# Patient Record
Sex: Female | Born: 1970 | Race: White | Hispanic: No | Marital: Single | Smoking: Former smoker
Health system: Southern US, Community
[De-identification: ages and names within clinical notes are randomized; demographics above are authoritative.]

## PROBLEM LIST (undated history)

## (undated) DIAGNOSIS — E282 Polycystic ovarian syndrome: Secondary | ICD-10-CM

## (undated) DIAGNOSIS — E079 Disorder of thyroid, unspecified: Secondary | ICD-10-CM

## (undated) DIAGNOSIS — M1990 Unspecified osteoarthritis, unspecified site: Secondary | ICD-10-CM

## (undated) DIAGNOSIS — D649 Anemia, unspecified: Secondary | ICD-10-CM

## (undated) DIAGNOSIS — Z973 Presence of spectacles and contact lenses: Secondary | ICD-10-CM

## (undated) DIAGNOSIS — G43909 Migraine, unspecified, not intractable, without status migrainosus: Secondary | ICD-10-CM

## (undated) HISTORY — PX: COLONOSCOPY: SHX174

## (undated) HISTORY — DX: Unspecified osteoarthritis, unspecified site: M19.90

## (undated) HISTORY — DX: Anemia, unspecified: D64.9

## (undated) HISTORY — PX: THYROIDECTOMY, PARTIAL: SHX18

---

## 2003-11-22 HISTORY — PX: GASTRIC BYPASS: SHX52

## 2006-11-21 HISTORY — PX: BREAST ENHANCEMENT SURGERY: SHX7

## 2006-11-21 HISTORY — PX: ABDOMINAL SURGERY: SHX537

## 2011-10-17 ENCOUNTER — Emergency Department (HOSPITAL_BASED_OUTPATIENT_CLINIC_OR_DEPARTMENT_OTHER)
Admission: EM | Admit: 2011-10-17 | Discharge: 2011-10-17 | Disposition: A | Discharge status: Home / Self Care | Payer: Federal, State, Local not specified - PPO | Attending: Emergency Medicine | Admitting: Emergency Medicine

## 2011-10-17 ENCOUNTER — Encounter: Payer: Self-pay | Admitting: *Deleted

## 2011-10-17 DIAGNOSIS — J329 Chronic sinusitis, unspecified: Secondary | ICD-10-CM | POA: Insufficient documentation

## 2011-10-17 DIAGNOSIS — E079 Disorder of thyroid, unspecified: Secondary | ICD-10-CM | POA: Insufficient documentation

## 2011-10-17 DIAGNOSIS — J45909 Unspecified asthma, uncomplicated: Secondary | ICD-10-CM | POA: Insufficient documentation

## 2011-10-17 HISTORY — DX: Disorder of thyroid, unspecified: E07.9

## 2011-10-17 LAB — URINALYSIS, ROUTINE W REFLEX MICROSCOPIC
Ketones, ur: NEGATIVE mg/dL
Leukocytes, UA: NEGATIVE
Nitrite: NEGATIVE
Specific Gravity, Urine: 1.006 (ref 1.005–1.030)
pH: 7 (ref 5.0–8.0)

## 2011-10-17 LAB — PREGNANCY, URINE: Preg Test, Ur: NEGATIVE

## 2011-10-17 MED ORDER — CIPROFLOXACIN-DEXAMETHASONE 0.3-0.1 % OT SUSP: 4.0000 [drp] | Freq: Two times a day (BID) | OTIC | Status: AC

## 2011-10-17 MED ORDER — LEVOFLOXACIN 500 MG PO TABS: 500.0000 mg | ORAL_TABLET | Freq: Every day | ORAL | Status: AC

## 2011-10-17 NOTE — ED Provider Notes (Signed)
History/physical exam/procedure(s) were performed by non-physician practitioner and as supervising physician I was immediately available for consultation/collaboration. I have reviewed all notes and am in agreement with care and plan.   Hilario Quarry, MD 10/17/11 [phone]

## 2011-10-17 NOTE — ED Notes (Signed)
Pt states she thinks she has a UTI. UA ordered and urine sample obtained.

## 2011-10-17 NOTE — ED Provider Notes (Signed)
History     CSN: [id number] Arrival date & time: 10/17/2011  9:24 PM   First MD Initiated Contact with Patient 10/17/11 2131      Chief Complaint  Patient presents with  . URI    (Consider location/radiation/quality/duration/timing/severity/associated sxs/prior treatment) Patient is a 40 y.o. female presenting with URI. The history is provided by the patient. No language interpreter was used.  URI The primary symptoms include sore throat, swollen glands, cough and wheezing. Primary symptoms do not include fever. The current episode started more than 1 week ago. This is a new problem. The problem has been gradually worsening.  The sore throat began more than 2 days ago. The sore throat has been gradually worsening since its onset. The sore throat is moderate in intensity. The sensation radiates to the left ear.  The cough began more than 1 week ago. The cough is non-productive. The sputum is clear. It is exacerbated by allergens.  Symptoms associated with the illness include chills, plugged ear sensation, facial pain, sinus pressure, congestion and rhinorrhea.  Pt complains of itching bilat ears.  Pt reports she has sinus drainage and congestion. Pt reports she has had sinus infections in the past.  Pt reports this feels the same.  Pt reports levaquin is what normally works.  Past Medical History  Diagnosis Date  . Thyroid disease   . Asthma     Past Surgical History  Procedure Date  . Gastric bypass     No family history on file.  History  Substance Use Topics  . Smoking status: Former Games developer  . Smokeless tobacco: Not on file  . Alcohol Use: No    OB History    Grav Para Term Preterm Abortions TAB SAB Ect Mult Living                  Review of Systems  Constitutional: Positive for chills. Negative for fever.  HENT: Positive for congestion, sore throat, rhinorrhea and sinus pressure.   Respiratory: Positive for cough and wheezing.   All other systems reviewed and  are negative.    Allergies  Review of patient's allergies indicates not on file.  Home Medications  No current outpatient prescriptions on file.  BP 126/82  Pulse 75  Temp(Src) 98.1 F (36.7 C) (Oral)  Resp 20  SpO2 99%  Physical Exam  Nursing note and vitals reviewed. Constitutional: She is oriented to person, place, and time. She appears well-developed and well-nourished.  HENT:  Head: Normocephalic and atraumatic.  Right Ear: External ear normal.  Left Ear: External ear normal.       Tender bilat sinuses  Eyes: Conjunctivae and EOM are normal. Pupils are equal, round, and reactive to light.  Neck: Normal range of motion. Neck supple.  Cardiovascular: Normal rate, regular rhythm and normal heart sounds.   Pulmonary/Chest: Effort normal and breath sounds normal.  Abdominal: Soft. Bowel sounds are normal.  Musculoskeletal: Normal range of motion.  Neurological: She is alert and oriented to person, place, and time.  Skin: Skin is warm.  Psychiatric: She has a normal mood and affect.    ED Course  Procedures (including critical care time)  Labs Reviewed  URINALYSIS, ROUTINE W REFLEX MICROSCOPIC - Abnormal; Notable for the following:    Appearance HAZY (*)    All other components within normal limits  PREGNANCY, URINE   No results found.   No diagnosis found.    MDM     Pt wants rx for levaquin and  cipro ear drops.  Pt has sinusitis.       Langston Masker, [place] 10/17/11 2201  Langston Masker, [place] 10/17/11 2204

## 2011-10-17 NOTE — ED Notes (Signed)
Sore throat, stuffy ears, cough with yellow sputum x 1 week.

## 2012-01-12 ENCOUNTER — Encounter (HOSPITAL_BASED_OUTPATIENT_CLINIC_OR_DEPARTMENT_OTHER): Payer: Self-pay

## 2012-01-12 ENCOUNTER — Emergency Department (HOSPITAL_BASED_OUTPATIENT_CLINIC_OR_DEPARTMENT_OTHER)
Admission: EM | Admit: 2012-01-12 | Discharge: 2012-01-12 | Disposition: A | Discharge status: Home / Self Care | Payer: Federal, State, Local not specified - PPO | Attending: Emergency Medicine | Admitting: Emergency Medicine

## 2012-01-12 ENCOUNTER — Emergency Department (INDEPENDENT_AMBULATORY_CARE_PROVIDER_SITE_OTHER): Payer: Federal, State, Local not specified - PPO

## 2012-01-12 DIAGNOSIS — R6889 Other general symptoms and signs: Secondary | ICD-10-CM | POA: Insufficient documentation

## 2012-01-12 DIAGNOSIS — Z79899 Other long term (current) drug therapy: Secondary | ICD-10-CM | POA: Insufficient documentation

## 2012-01-12 DIAGNOSIS — J069 Acute upper respiratory infection, unspecified: Secondary | ICD-10-CM

## 2012-01-12 DIAGNOSIS — R109 Unspecified abdominal pain: Secondary | ICD-10-CM | POA: Insufficient documentation

## 2012-01-12 DIAGNOSIS — R05 Cough: Secondary | ICD-10-CM | POA: Insufficient documentation

## 2012-01-12 DIAGNOSIS — R5381 Other malaise: Secondary | ICD-10-CM | POA: Insufficient documentation

## 2012-01-12 DIAGNOSIS — R0982 Postnasal drip: Secondary | ICD-10-CM | POA: Insufficient documentation

## 2012-01-12 DIAGNOSIS — R319 Hematuria, unspecified: Secondary | ICD-10-CM | POA: Insufficient documentation

## 2012-01-12 DIAGNOSIS — J3489 Other specified disorders of nose and nasal sinuses: Secondary | ICD-10-CM | POA: Insufficient documentation

## 2012-01-12 DIAGNOSIS — R1110 Vomiting, unspecified: Secondary | ICD-10-CM | POA: Insufficient documentation

## 2012-01-12 DIAGNOSIS — N390 Urinary tract infection, site not specified: Secondary | ICD-10-CM | POA: Insufficient documentation

## 2012-01-12 DIAGNOSIS — R059 Cough, unspecified: Secondary | ICD-10-CM | POA: Insufficient documentation

## 2012-01-12 DIAGNOSIS — H9209 Otalgia, unspecified ear: Secondary | ICD-10-CM | POA: Insufficient documentation

## 2012-01-12 DIAGNOSIS — R300 Dysuria: Secondary | ICD-10-CM | POA: Insufficient documentation

## 2012-01-12 DIAGNOSIS — J45909 Unspecified asthma, uncomplicated: Secondary | ICD-10-CM | POA: Insufficient documentation

## 2012-01-12 HISTORY — DX: Polycystic ovarian syndrome: E28.2

## 2012-01-12 LAB — URINALYSIS, ROUTINE W REFLEX MICROSCOPIC
Glucose, UA: NEGATIVE mg/dL
Ketones, ur: NEGATIVE mg/dL
Leukocytes, UA: NEGATIVE
Protein, ur: NEGATIVE mg/dL

## 2012-01-12 LAB — URINE MICROSCOPIC-ADD ON

## 2012-01-12 MED ORDER — HYDROCODONE-HOMATROPINE 5-1.5 MG/5ML PO SYRP: 5.0000 mL | ORAL_SOLUTION | Freq: Four times a day (QID) | ORAL | Status: AC | PRN

## 2012-01-12 MED ORDER — CIPROFLOXACIN HCL 500 MG PO TABS: 500.0000 mg | ORAL_TABLET | Freq: Two times a day (BID) | ORAL | Status: AC

## 2012-01-12 MED ORDER — LEVOFLOXACIN 500 MG PO TABS: 500.0000 mg | ORAL_TABLET | Freq: Every day | ORAL | Status: AC

## 2012-01-12 MED ORDER — CIPROFLOXACIN HCL 500 MG PO TABS
500.0000 mg | ORAL_TABLET | Freq: Once | ORAL | Status: AC
  Administered 2012-01-12: 500 mg via ORAL
  Filled 2012-01-12: qty 1

## 2012-01-12 NOTE — ED Provider Notes (Signed)
History     CSN: [id number]  Arrival date & time 01/12/12  5784   First MD Initiated Contact with Patient 01/12/12 2036      Chief Complaint  Patient presents with  . URI    (Consider location/radiation/quality/duration/timing/severity/associated sxs/prior treatment) HPI Comments: Patient presents with one-week history of runny nose cough and congestion. She has a cough productive of yellow sputum. She does have some posttussive emesis. No other vomiting or diarrhea. No fevers. She also complains of some burning on urination for one day. She said she has a history of UTIs in the past and is still similar. Denies any vaginal bleeding or discharge. She's been taking over-the-counter medicines for her cold without relief.  Patient is a 41 y.o. female presenting with URI. The history is provided by the patient.  URI The primary symptoms include fatigue, ear pain, cough, abdominal pain and vomiting. Primary symptoms do not include fever, headaches, nausea, arthralgias or rash.  Symptoms associated with the illness include congestion and rhinorrhea. The illness is not associated with chills.    Past Medical History  Diagnosis Date  . Thyroid disease   . Asthma   . PCOS (polycystic ovarian syndrome)     Past Surgical History  Procedure Date  . Gastric bypass   . Thyroidectomy, partial   . Abdominal surgery     No family history on file.  History  Substance Use Topics  . Smoking status: Former Games developer  . Smokeless tobacco: Not on file  . Alcohol Use: No    OB History    Grav Para Term Preterm Abortions TAB SAB Ect Mult Living                  Review of Systems  Constitutional: Positive for fatigue. Negative for fever, chills and diaphoresis.  HENT: Positive for ear pain, congestion, rhinorrhea and postnasal drip. Negative for sneezing.   Eyes: Negative.   Respiratory: Positive for cough. Negative for chest tightness and shortness of breath.   Cardiovascular: Negative  for chest pain and leg swelling.  Gastrointestinal: Positive for vomiting and abdominal pain. Negative for nausea, diarrhea and blood in stool.  Genitourinary: Positive for dysuria. Negative for frequency, hematuria, flank pain and difficulty urinating.  Musculoskeletal: Negative for back pain and arthralgias.  Skin: Negative for rash.  Neurological: Negative for dizziness, speech difficulty, weakness, numbness and headaches.    Allergies  Penicillins; Iodine; and Sulfa antibiotics  Home Medications   Current Outpatient Rx  Name Route Sig Dispense Refill  . ALBUTEROL SULFATE HFA 108 (90 BASE) MCG/ACT IN AERS Inhalation Inhale 2 puffs into the lungs every 6 (six) hours as needed. For wheezing or shortness of breath    . ASPIRIN-ACETAMINOPHEN-CAFFEINE 250-250-65 MG PO TABS Oral Take 1 tablet by mouth every 6 (six) hours as needed. For migraine    . BUTALBITAL-ASA-CAFF-CODEINE 50-325-40-30 MG PO CAPS Oral Take 1 capsule by mouth every 4 (four) hours as needed. For migraines    . LEVOTHYROXINE SODIUM 125 MCG PO TABS Oral Take 125 mcg by mouth daily.    Marland Kitchen METFORMIN HCL 500 MG PO TABS Oral Take 500 mg by mouth 2 (two) times daily.    Marland Kitchen CIPROFLOXACIN HCL 500 MG PO TABS Oral Take 1 tablet (500 mg total) by mouth 2 (two) times daily. 20 tablet 0  . HYDROCODONE-HOMATROPINE 5-1.5 MG/5ML PO SYRP Oral Take 5 mLs by mouth every 6 (six) hours as needed for cough. 120 mL 0  . LEVOFLOXACIN 500 MG  PO TABS Oral Take 1 tablet (500 mg total) by mouth daily. 7 tablet 0    BP 114/73  Pulse 78  Temp(Src) 98 F (36.7 C) (Oral)  Resp 18  Ht 5\' 7"  (1.702 m)  Wt 160 lb (72.576 kg)  BMI 25.06 kg/m2  SpO2 100%  LMP 01/12/2012  Physical Exam  Constitutional: She is oriented to person, place, and time. She appears well-developed and well-nourished.  HENT:  Head: Normocephalic and atraumatic.  Eyes: Pupils are equal, round, and reactive to light.  Neck: Normal range of motion. Neck supple.  Cardiovascular:  Normal rate, regular rhythm and normal heart sounds.   Pulmonary/Chest: Effort normal and breath sounds normal. No respiratory distress. She has no wheezes. She has no rales. She exhibits no tenderness.  Abdominal: Soft. Bowel sounds are normal. There is tenderness. There is no rebound and no guarding.       Mild tenderness in suprapubic area no rebound or guarding  Musculoskeletal: Normal range of motion. She exhibits no edema.  Lymphadenopathy:    She has no cervical adenopathy.  Neurological: She is alert and oriented to person, place, and time.  Skin: Skin is warm and dry. No rash noted.  Psychiatric: She has a normal mood and affect.    ED Course  Procedures (including critical care time)  Results for orders placed during the hospital encounter of 01/12/12  PREGNANCY, URINE      Component Value Range   Preg Test, Ur NEGATIVE  NEGATIVE   URINALYSIS, ROUTINE W REFLEX MICROSCOPIC      Component Value Range   Color, Urine YELLOW  YELLOW    APPearance CLOUDY (*) CLEAR    Specific Gravity, Urine 1.005  1.005 - 1.030    pH 7.0  5.0 - 8.0    Glucose, UA NEGATIVE  NEGATIVE (mg/dL)   Hgb urine dipstick LARGE (*) NEGATIVE    Bilirubin Urine NEGATIVE  NEGATIVE    Ketones, ur NEGATIVE  NEGATIVE (mg/dL)   Protein, ur NEGATIVE  NEGATIVE (mg/dL)   Urobilinogen, UA 0.2  0.0 - 1.0 (mg/dL)   Nitrite NEGATIVE  NEGATIVE    Leukocytes, UA NEGATIVE  NEGATIVE   URINE MICROSCOPIC-ADD ON      Component Value Range   Squamous Epithelial / LPF FEW (*) RARE    WBC, UA 0-2  <3 (WBC/hpf)   RBC / HPF 7-10  <3 (RBC/hpf)   Bacteria, UA MANY (*) RARE    No results found.   Dg Chest 2 View  01/12/2012  *RADIOLOGY REPORT*  Clinical Data: Upper respiratory infection and cough.  CHEST - 2 VIEW  Comparison: None.  Findings: Two views of the chest were obtained.  The lungs are clear.  Heart and mediastinum are within normal limits and the trachea is midline.  Negative for edema or pleural effusions.   IMPRESSION: No acute chest findings.  Original Report Authenticated By: Richarda Overlie, M.D.     1. URI (upper respiratory infection)   2. UTI (lower urinary tract infection)   3. Hematuria       MDM  No evidence of pneumonia.  Advised to f/u with PMD to recheck urine to make sure that blood clears        Rolan Bucco, MD 01/12/12 2316

## 2012-01-12 NOTE — ED Notes (Signed)
C/o "uri infection and uti"

## 2012-01-12 NOTE — Discharge Instructions (Signed)
Upper Respiratory Infection, Adult An upper respiratory infection (URI) is also sometimes known as the common cold. The upper respiratory tract includes the nose, sinuses, throat, trachea, and bronchi. Bronchi are the airways leading to the lungs. Most people improve within 1 week, but symptoms can last up to 2 weeks. A residual cough may last even longer.  CAUSES Many different viruses can infect the tissues lining the upper respiratory tract. The tissues become irritated and inflamed and often become very moist. Mucus production is also common. A cold is contagious. You can easily spread the virus to others by oral contact. This includes kissing, sharing a glass, coughing, or sneezing. Touching your mouth or nose and then touching a surface, which is then touched by another person, can also spread the virus. SYMPTOMS  Symptoms typically develop 1 to 3 days after you come in contact with a cold virus. Symptoms vary from person to person. They may include:  Runny nose.   Sneezing.   Nasal congestion.   Sinus irritation.   Sore throat.   Loss of voice (laryngitis).   Cough.   Fatigue.   Muscle aches.   Loss of appetite.   Headache.   Low-grade fever.  DIAGNOSIS  You might diagnose your own cold based on familiar symptoms, since most people get a cold 2 to 3 times a year. Your caregiver can confirm this based on your exam. Most importantly, your caregiver can check that your symptoms are not due to another disease such as strep throat, sinusitis, pneumonia, asthma, or epiglottitis. Blood tests, throat tests, and X-rays are not necessary to diagnose a common cold, but they may sometimes be helpful in excluding other more serious diseases. Your caregiver will decide if any further tests are required. RISKS AND COMPLICATIONS  You may be at risk for a more severe case of the common cold if you smoke cigarettes, have chronic heart disease (such as heart failure) or lung disease (such as  asthma), or if you have a weakened immune system. The very young and very old are also at risk for more serious infections. Bacterial sinusitis, middle ear infections, and bacterial pneumonia can complicate the common cold. The common cold can worsen asthma and chronic obstructive pulmonary disease (COPD). Sometimes, these complications can require emergency medical care and may be life-threatening. PREVENTION  The best way to protect against getting a cold is to practice good hygiene. Avoid oral or hand contact with people with cold symptoms. Wash your hands often if contact occurs. There is no clear evidence that vitamin C, vitamin E, echinacea, or exercise reduces the chance of developing a cold. However, it is always recommended to get plenty of rest and practice good nutrition. TREATMENT  Treatment is directed at relieving symptoms. There is no cure. Antibiotics are not effective, because the infection is caused by a virus, not by bacteria. Treatment may include:  Increased fluid intake. Sports drinks offer valuable electrolytes, sugars, and fluids.   Breathing heated mist or steam (vaporizer or shower).   Eating chicken soup or other clear broths, and maintaining good nutrition.   Getting plenty of rest.   Using gargles or lozenges for comfort.   Controlling fevers with ibuprofen or acetaminophen as directed by your caregiver.   Increasing usage of your inhaler if you have asthma.  Zinc gel and zinc lozenges, taken in the first 24 hours of the common cold, can shorten the duration and lessen the severity of symptoms. Pain medicines may help with fever, muscle   aches, and throat pain. A variety of non-prescription medicines are available to treat congestion and runny nose. Your caregiver can make recommendations and may suggest nasal or lung inhalers for other symptoms.  HOME CARE INSTRUCTIONS   Only take over-the-counter or prescription medicines for pain, discomfort, or fever as directed  by your caregiver.   Use a warm mist humidifier or inhale steam from a shower to increase air moisture. This may keep secretions moist and make it easier to breathe.   Drink enough water and fluids to keep your urine clear or pale yellow.   Rest as needed.   Return to work when your temperature has returned to normal or as your caregiver advises. You may need to stay home longer to avoid infecting others. You can also use a face mask and careful hand washing to prevent spread of the virus.  SEEK MEDICAL CARE IF:   After the first few days, you feel you are getting worse rather than better.   You need your caregiver's advice about medicines to control symptoms.   You develop chills, worsening shortness of breath, or brown or red sputum. These may be signs of pneumonia.   You develop yellow or brown nasal discharge or pain in the face, especially when you bend forward. These may be signs of sinusitis.   You develop a fever, swollen neck glands, pain with swallowing, or white areas in the back of your throat. These may be signs of strep throat.  SEEK IMMEDIATE MEDICAL CARE IF:   You have a fever.   You develop severe or persistent headache, ear pain, sinus pain, or chest pain.   You develop wheezing, a prolonged cough, cough up blood, or have a change in your usual mucus (if you have chronic lung disease).   You develop sore muscles or a stiff neck.  Document Released: 05/03/2001 Document Revised: 07/20/2011 Document Reviewed: 03/11/2011 [employer] Patient Information 2012 [employer].Urinary Tract Infection Infections of the urinary tract can start in several places. A bladder infection (cystitis), a kidney infection (pyelonephritis), and a prostate infection (prostatitis) are different types of urinary tract infections (UTIs). They usually get better if treated with medicines (antibiotics) that kill germs. Take all the medicine until it is gone. You or your child may feel better in a  few days, but TAKE ALL MEDICINE or the infection may not respond and may become more difficult to treat. HOME CARE INSTRUCTIONS   Drink enough water and fluids to keep the urine clear or pale yellow. Cranberry juice is especially recommended, in addition to large amounts of water.   Avoid caffeine, tea, and carbonated beverages. They tend to irritate the bladder.   Alcohol may irritate the prostate.   Only take over-the-counter or prescription medicines for pain, discomfort, or fever as directed by your caregiver.  To prevent further infections:  Empty the bladder often. Avoid holding urine for long periods of time.   After a bowel movement, women should cleanse from front to back. Use each tissue only once.   Empty the bladder before and after sexual intercourse.  FINDING OUT THE RESULTS OF YOUR TEST Not all test results are available during your visit. If your or your child's test results are not back during the visit, make an appointment with your caregiver to find out the results. Do not assume everything is normal if you have not heard from your caregiver or the medical facility. It is important for you to follow up on all test results.   SEEK MEDICAL CARE IF:   There is back pain.   Your baby is older than 3 months with a rectal temperature of 100.5 F (38.1 C) or higher for more than 1 day.   Your or your child's problems (symptoms) are no better in 3 days. Return sooner if you or your child is getting worse.  SEEK IMMEDIATE MEDICAL CARE IF:   There is severe back pain or lower abdominal pain.   You or your child develops chills.   You have a fever.   Your baby is older than 3 months with a rectal temperature of 102 F (38.9 C) or higher.   Your baby is 3 months old or younger with a rectal temperature of 100.4 F (38 C) or higher.   There is nausea or vomiting.   There is continued burning or discomfort with urination.  MAKE SURE YOU:   Understand these  instructions.   Will watch your condition.   Will get help right away if you are not doing well or get worse.  Document Released: 08/17/2005 Document Revised: 07/20/2011 Document Reviewed: 03/22/2007 Exit[employer] Patient Information 2012 [employer]. 

## 2012-02-13 ENCOUNTER — Emergency Department (INDEPENDENT_AMBULATORY_CARE_PROVIDER_SITE_OTHER): Payer: Federal, State, Local not specified - PPO

## 2012-02-13 ENCOUNTER — Emergency Department (HOSPITAL_BASED_OUTPATIENT_CLINIC_OR_DEPARTMENT_OTHER)
Admission: EM | Admit: 2012-02-13 | Discharge: 2012-02-13 | Disposition: A | Discharge status: Home / Self Care | Payer: Federal, State, Local not specified - PPO | Attending: Emergency Medicine | Admitting: Emergency Medicine

## 2012-02-13 ENCOUNTER — Encounter (HOSPITAL_BASED_OUTPATIENT_CLINIC_OR_DEPARTMENT_OTHER): Payer: Self-pay | Admitting: *Deleted

## 2012-02-13 DIAGNOSIS — R05 Cough: Secondary | ICD-10-CM

## 2012-02-13 DIAGNOSIS — J019 Acute sinusitis, unspecified: Secondary | ICD-10-CM | POA: Insufficient documentation

## 2012-02-13 DIAGNOSIS — R300 Dysuria: Secondary | ICD-10-CM | POA: Insufficient documentation

## 2012-02-13 DIAGNOSIS — R059 Cough, unspecified: Secondary | ICD-10-CM | POA: Insufficient documentation

## 2012-02-13 DIAGNOSIS — R0989 Other specified symptoms and signs involving the circulatory and respiratory systems: Secondary | ICD-10-CM

## 2012-02-13 LAB — DIFFERENTIAL
Basophils Absolute: 0 10*3/uL (ref 0.0–0.1)
Basophils Relative: 0 % (ref 0–1)
Neutro Abs: 5.3 10*3/uL (ref 1.7–7.7)
Neutrophils Relative %: 62 % (ref 43–77)

## 2012-02-13 LAB — CBC
MCHC: 33.3 g/dL (ref 30.0–36.0)
Platelets: 317 10*3/uL (ref 150–400)
RDW: 11.9 % (ref 11.5–15.5)

## 2012-02-13 LAB — BASIC METABOLIC PANEL
Chloride: 104 mEq/L (ref 96–112)
GFR calc Af Amer: >90 mL/min (ref >90)
Potassium: 3.8 mEq/L (ref 3.5–5.1)

## 2012-02-13 LAB — URINALYSIS, ROUTINE W REFLEX MICROSCOPIC
Bilirubin Urine: NEGATIVE
Leukocytes, UA: NEGATIVE
Nitrite: NEGATIVE
Specific Gravity, Urine: 1.003 — ABNORMAL LOW (ref 1.005–1.030)
pH: 7 (ref 5.0–8.0)

## 2012-02-13 LAB — URINE MICROSCOPIC-ADD ON

## 2012-02-13 MED ORDER — LEVOFLOXACIN 500 MG PO TABS: 500.0000 mg | ORAL_TABLET | Freq: Every day | ORAL | Status: AC

## 2012-02-13 MED ORDER — LEVOFLOXACIN 750 MG PO TABS: 750.0000 mg | ORAL_TABLET | Freq: Every day | ORAL | Status: DC

## 2012-02-13 NOTE — ED Notes (Signed)
Earache, cough, sore throat. Symptoms x 1.5 weeks. Thinks she has a UTI.

## 2012-02-13 NOTE — ED Provider Notes (Signed)
History     CSN: [id number]  Arrival date & time 02/13/12  1609   First MD Initiated Contact with Patient 02/13/12 1634      Chief Complaint  Patient presents with  . URI    (Consider location/radiation/quality/duration/timing/severity/associated sxs/prior treatment) HPI The patient is a 41 yo woman, history of asthma, presenting with cough.  The patient notes a 1.5-week history of rhinorrhea, cough productive of yellow sputum, which has progressed to green sputum in the last 2 days, bilateral ear pain L > R, muscle aches, chills, and lightheadedness, though no fevers.  She has a history of frequent prior URI's, and reports being evaluated by ENT in the past with no structural cause of frequent URI's identified.  The patient also notes a 1-week history of dysuria and urinary frequency, though without urgency or suprapubic pain.  The patient has a history of asthma, with prior hospitalizations for asthma but no prior intubations.  Past Medical History  Diagnosis Date  . Thyroid disease   . Asthma   . PCOS (polycystic ovarian syndrome)     Past Surgical History  Procedure Date  . Gastric bypass   . Thyroidectomy, partial   . Abdominal surgery     No family history on file.  History  Substance Use Topics  . Smoking status: Former Games developer  . Smokeless tobacco: Not on file  . Alcohol Use: No    OB History    Grav Para Term Preterm Abortions TAB SAB Ect Mult Living                  Review of Systems General: no fevers, chills, changes in weight, changes in appetite Skin: no rash HEENT: see HPI Pulm: see HPI CV: no chest pain, palpitations, shortness of breath Abd: no abdominal pain, nausea/vomiting, diarrhea/constipation GU: see HPI Ext: no arthralgias, myalgias Neuro: no weakness, numbness, or tingling  Allergies  Penicillins; Iodine; and Sulfa antibiotics  Home Medications   Current Outpatient Rx  Name Route Sig Dispense Refill  . ALBUTEROL SULFATE HFA 108  (90 BASE) MCG/ACT IN AERS Inhalation Inhale 2 puffs into the lungs every 6 (six) hours as needed. For wheezing or shortness of breath    . ASPIRIN-ACETAMINOPHEN-CAFFEINE 250-250-65 MG PO TABS Oral Take 1 tablet by mouth every 6 (six) hours as needed. For migraine    . BUTALBITAL-ASA-CAFF-CODEINE 50-325-40-30 MG PO CAPS Oral Take 1 capsule by mouth every 4 (four) hours as needed. For migraines    . LEVOTHYROXINE SODIUM 125 MCG PO TABS Oral Take 125 mcg by mouth daily.    Marland Kitchen METFORMIN HCL 500 MG PO TABS Oral Take 500 mg by mouth 2 (two) times daily.      BP 111/75  Pulse 65  Temp(Src) 97.6 F (36.4 C) (Oral)  Resp 20  SpO2 100%  Physical Exam  ED Course  Procedures (including critical care time)  Labs Reviewed  URINALYSIS, ROUTINE W REFLEX MICROSCOPIC - Abnormal; Notable for the following:    Specific Gravity, Urine 1.003 (*)    Hgb urine dipstick TRACE (*)    All other components within normal limits  URINE MICROSCOPIC-ADD ON  CBC  DIFFERENTIAL  BASIC METABOLIC PANEL   No results found.   No diagnosis found.    MDM  The patient is a 41 yo woman, history of asthma, presenting with cough  # Cough - likely represents viral URI vs acute sinusitis.  CXR ordered to evaluate for pneumonia.  The patient also has a history  of asthma, which may be contributing to cough. -cbc, bmet -cxr  # Dysuria - reports a 1-week history of dysuria, but UA shows neg nitrates and leuk esterase.  Patient likely does not have UTI, no further treatment necessary.  # Dispo - will await labs and imaging, but patient can likely be discharged home.  Signed, Janalyn Harder, PGY1 02/13/2012, 5:31 PM  Addendum 6:42 pm - cxr shows no opacity, labs unremarkable.  Since symptoms have persisted 11 days, will treat acute sinusitis with antibiotics.  Patient has sulfa and pcn allergy, and reports that doxycycline "hasn't worked for her in the past".  Will prescribe a 10-day course of levofloxacin 500 mg  PO.  Linward Headland, MD 02/13/12 [phone]

## 2012-02-13 NOTE — Discharge Instructions (Signed)

## 2012-02-13 NOTE — ED Notes (Signed)
Pt dressed and in hallway requesting d/c-advised EDP has not completed and would d/c asap

## 2012-02-14 NOTE — ED Provider Notes (Signed)
I saw and evaluated the patient, reviewed the resident's note and I agree with the findings and plan.   .Face to face Exam:  General:  Awake HEENT:  Atraumatic Resp:  Normal effort Abd:  Nondistended Neuro:No focal weakness Lymph: No adenopathy   Nelia Shi, MD 02/14/12 1244

## 2012-07-14 ENCOUNTER — Emergency Department (HOSPITAL_BASED_OUTPATIENT_CLINIC_OR_DEPARTMENT_OTHER)
Admission: EM | Admit: 2012-07-14 | Discharge: 2012-07-14 | Disposition: A | Discharge status: Home / Self Care | Payer: Federal, State, Local not specified - PPO | Attending: Emergency Medicine | Admitting: Emergency Medicine

## 2012-07-14 ENCOUNTER — Encounter (HOSPITAL_BASED_OUTPATIENT_CLINIC_OR_DEPARTMENT_OTHER): Payer: Self-pay | Admitting: *Deleted

## 2012-07-14 ENCOUNTER — Emergency Department (HOSPITAL_BASED_OUTPATIENT_CLINIC_OR_DEPARTMENT_OTHER): Payer: Federal, State, Local not specified - PPO

## 2012-07-14 DIAGNOSIS — E282 Polycystic ovarian syndrome: Secondary | ICD-10-CM | POA: Insufficient documentation

## 2012-07-14 DIAGNOSIS — E079 Disorder of thyroid, unspecified: Secondary | ICD-10-CM | POA: Insufficient documentation

## 2012-07-14 DIAGNOSIS — J45909 Unspecified asthma, uncomplicated: Secondary | ICD-10-CM | POA: Insufficient documentation

## 2012-07-14 DIAGNOSIS — Z880 Allergy status to penicillin: Secondary | ICD-10-CM | POA: Insufficient documentation

## 2012-07-14 DIAGNOSIS — Z882 Allergy status to sulfonamides status: Secondary | ICD-10-CM | POA: Insufficient documentation

## 2012-07-14 DIAGNOSIS — J329 Chronic sinusitis, unspecified: Secondary | ICD-10-CM

## 2012-07-14 DIAGNOSIS — Z9884 Bariatric surgery status: Secondary | ICD-10-CM | POA: Insufficient documentation

## 2012-07-14 DIAGNOSIS — Z87891 Personal history of nicotine dependence: Secondary | ICD-10-CM | POA: Insufficient documentation

## 2012-07-14 LAB — PREGNANCY, URINE: Preg Test, Ur: NEGATIVE

## 2012-07-14 LAB — URINALYSIS, ROUTINE W REFLEX MICROSCOPIC
Bilirubin Urine: NEGATIVE
Nitrite: NEGATIVE
Protein, ur: NEGATIVE mg/dL
Specific Gravity, Urine: 1.019 (ref 1.005–1.030)
Urobilinogen, UA: 0.2 mg/dL (ref 0.0–1.0)

## 2012-07-14 MED ORDER — AZITHROMYCIN 250 MG PO TABS: 250.0000 mg | ORAL_TABLET | Freq: Every day | ORAL | Status: AC

## 2012-07-14 NOTE — ED Provider Notes (Addendum)
History     CSN: [id number]  Arrival date & time 07/14/12  1401   First MD Initiated Contact with Patient 07/14/12 1417      Chief Complaint  Patient presents with  . Cough    (Consider location/radiation/quality/duration/timing/severity/associated sxs/prior treatment) Patient is a 41 y.o. female presenting with cough. The history is provided by the patient.  Cough This is a new problem. Episode onset: about 1 week. The problem occurs hourly. The problem has not changed since onset.The cough is non-productive. There has been no fever. Associated symptoms include ear congestion and rhinorrhea. Pertinent negatives include no shortness of breath and no wheezing. Associated symptoms comments: Sinus congestion and facial pain for 2 weeks. She has tried decongestants for the symptoms. The treatment provided no relief. She is not a smoker. Past medical history comments: hx of sinusitis.    Past Medical History  Diagnosis Date  . Thyroid disease   . Asthma   . PCOS (polycystic ovarian syndrome)     Past Surgical History  Procedure Date  . Gastric bypass   . Thyroidectomy, partial   . Abdominal surgery     History reviewed. No pertinent family history.  History  Substance Use Topics  . Smoking status: Former Games developer  . Smokeless tobacco: Not on file  . Alcohol Use: No    OB History    Grav Para Term Preterm Abortions TAB SAB Ect Mult Living                  Review of Systems  Constitutional: Negative for fever.  HENT: Positive for rhinorrhea.   Respiratory: Positive for cough. Negative for shortness of breath and wheezing.        Post tussive emesis  All other systems reviewed and are negative.    Allergies  Penicillins; Iodine; and Sulfa antibiotics  Home Medications   Current Outpatient Rx  Name Route Sig Dispense Refill  . ALBUTEROL SULFATE HFA 108 (90 BASE) MCG/ACT IN AERS Inhalation Inhale 2 puffs into the lungs every 6 (six) hours as needed. For wheezing  or shortness of breath    . ASPIRIN-ACETAMINOPHEN-CAFFEINE 250-250-65 MG PO TABS Oral Take 1 tablet by mouth every 6 (six) hours as needed. For migraine    . BUTALBITAL-ASA-CAFF-CODEINE 50-325-40-30 MG PO CAPS Oral Take 1 capsule by mouth every 4 (four) hours as needed. For migraines    . LEVOTHYROXINE SODIUM 125 MCG PO TABS Oral Take 125 mcg by mouth daily.    Marland Kitchen METFORMIN HCL 500 MG PO TABS Oral Take 500 mg by mouth 2 (two) times daily.      BP 118/75  Pulse 91  Temp 97.7 F (36.5 C) (Oral)  Resp 18  SpO2 97%  LMP 06/30/2012  Physical Exam  Nursing note and vitals reviewed. Constitutional: She is oriented to person, place, and time. She appears well-developed and well-nourished. No distress.  HENT:  Head: Normocephalic and atraumatic.  Right Ear: Tympanic membrane and ear canal normal.  Left Ear: Tympanic membrane and ear canal normal.  Nose: Mucosal edema present. Right sinus exhibits maxillary sinus tenderness and frontal sinus tenderness. Left sinus exhibits maxillary sinus tenderness and frontal sinus tenderness.  Mouth/Throat: Oropharynx is clear and moist and mucous membranes are normal.  Eyes: Conjunctivae and EOM are normal. Pupils are equal, round, and reactive to light.  Neck: Normal range of motion. Neck supple.  Cardiovascular: Normal rate, regular rhythm and intact distal pulses.   No murmur heard. Pulmonary/Chest: Effort normal and breath  sounds normal. No respiratory distress. She has no wheezes. She has no rales.  Musculoskeletal: Normal range of motion. She exhibits no edema and no tenderness.  Neurological: She is alert and oriented to person, place, and time.  Skin: Skin is warm and dry. No rash noted. No erythema.  Psychiatric: Her behavior is normal. Her mood appears anxious.    ED Course  Procedures (including critical care time)   Labs Reviewed  PREGNANCY, URINE  URINALYSIS, ROUTINE W REFLEX MICROSCOPIC   Dg Chest 2 View  07/14/2012  *RADIOLOGY  REPORT*  Clinical Data: Cough and congestion.  Shortness of breath.  CHEST - 2 VIEW  Comparison: 02/13/2012  Findings: The cardiomediastinal silhouette is unremarkable. The lungs are clear. There is no evidence of focal airspace disease, pulmonary edema, suspicious pulmonary nodule/mass, pleural effusion, or pneumothorax. No acute bony abnormalities are identified.  IMPRESSION: No evidence of active cardiopulmonary disease.   Original Report Authenticated By: Rosendo Gros, M.D.      1. Sinusitis       MDM    Pt with symptoms consistent with viral URI with developing sinusitis with sx for 2 weeks and sinus tenderness.  Well appearing here.  No signs of breathing difficulty  No signs of pharyngitis, otitis or abnormal abdominal findings.   CXR wnl and pt to return with any further problems. Pt treated with abx.         Gwyneth Sprout, MD 07/14/12 0454  Gwyneth Sprout, MD 07/14/12 [phone]

## 2012-07-14 NOTE — ED Notes (Signed)
Patient transported to X-ray via stretcher 

## 2012-07-14 NOTE — ED Notes (Signed)
Pt has had congestion x 2 weeks. Also wants urine checked.

## 2012-07-21 ENCOUNTER — Emergency Department (HOSPITAL_BASED_OUTPATIENT_CLINIC_OR_DEPARTMENT_OTHER)
Admission: EM | Admit: 2012-07-21 | Discharge: 2012-07-21 | Disposition: A | Discharge status: Home / Self Care | Payer: Federal, State, Local not specified - PPO | Attending: Emergency Medicine | Admitting: Emergency Medicine

## 2012-07-21 ENCOUNTER — Encounter (HOSPITAL_BASED_OUTPATIENT_CLINIC_OR_DEPARTMENT_OTHER): Payer: Self-pay | Admitting: *Deleted

## 2012-07-21 DIAGNOSIS — Z882 Allergy status to sulfonamides status: Secondary | ICD-10-CM | POA: Insufficient documentation

## 2012-07-21 DIAGNOSIS — J329 Chronic sinusitis, unspecified: Secondary | ICD-10-CM

## 2012-07-21 DIAGNOSIS — J45909 Unspecified asthma, uncomplicated: Secondary | ICD-10-CM | POA: Insufficient documentation

## 2012-07-21 DIAGNOSIS — Z9884 Bariatric surgery status: Secondary | ICD-10-CM | POA: Insufficient documentation

## 2012-07-21 DIAGNOSIS — Z87891 Personal history of nicotine dependence: Secondary | ICD-10-CM | POA: Insufficient documentation

## 2012-07-21 DIAGNOSIS — E282 Polycystic ovarian syndrome: Secondary | ICD-10-CM | POA: Insufficient documentation

## 2012-07-21 DIAGNOSIS — Z880 Allergy status to penicillin: Secondary | ICD-10-CM | POA: Insufficient documentation

## 2012-07-21 DIAGNOSIS — E079 Disorder of thyroid, unspecified: Secondary | ICD-10-CM | POA: Insufficient documentation

## 2012-07-21 DIAGNOSIS — J40 Bronchitis, not specified as acute or chronic: Secondary | ICD-10-CM

## 2012-07-21 LAB — URINALYSIS, ROUTINE W REFLEX MICROSCOPIC
Ketones, ur: NEGATIVE mg/dL
Protein, ur: NEGATIVE mg/dL
Urobilinogen, UA: 0.2 mg/dL (ref 0.0–1.0)

## 2012-07-21 LAB — URINE MICROSCOPIC-ADD ON

## 2012-07-21 MED ORDER — LEVOFLOXACIN 500 MG PO TABS: 500.0000 mg | ORAL_TABLET | Freq: Every day | ORAL | Status: AC

## 2012-07-21 MED ORDER — ALBUTEROL SULFATE HFA 108 (90 BASE) MCG/ACT IN AERS: 2.0000 | INHALATION_SPRAY | RESPIRATORY_TRACT | Status: DC | PRN

## 2012-07-21 NOTE — ED Notes (Signed)
Pt seen here last Sat and placed on Z-pak. Finished, but still having s/s. Also wants urine checked. Dieting.

## 2012-07-21 NOTE — ED Provider Notes (Signed)
History     CSN: [id number]  Arrival date & time 07/21/12  1518   First MD Initiated Contact with Patient 07/21/12 1546      Chief Complaint  Patient presents with  . Cough    (Consider location/radiation/quality/duration/timing/severity/associated sxs/prior treatment) Patient is a 41 y.o. female presenting with cough. The history is provided by the patient. No language interpreter was used.  Cough This is a recurrent problem. The current episode started more than 1 week ago. The problem occurs constantly. The cough is productive of sputum. There has been no fever. Associated symptoms include chest pain. She has tried nothing for the symptoms. Her past medical history is significant for bronchitis and asthma.    Past Medical History  Diagnosis Date  . Thyroid disease   . Asthma   . PCOS (polycystic ovarian syndrome)     Past Surgical History  Procedure Date  . Gastric bypass   . Thyroidectomy, partial   . Abdominal surgery     History reviewed. No pertinent family history.  History  Substance Use Topics  . Smoking status: Former Games developer  . Smokeless tobacco: Not on file  . Alcohol Use: No    OB History    Grav Para Term Preterm Abortions TAB SAB Ect Mult Living                  Review of Systems  Respiratory: Positive for cough.   Cardiovascular: Positive for chest pain.  All other systems reviewed and are negative.    Allergies  Penicillins; Iodine; and Sulfa antibiotics  Home Medications   Current Outpatient Rx  Name Route Sig Dispense Refill  . ALBUTEROL SULFATE HFA 108 (90 BASE) MCG/ACT IN AERS Inhalation Inhale 4 puffs into the lungs every 6 (six) hours as needed. For wheezing or shortness of breath    . AZITHROMYCIN 250 MG PO TABS Oral Take 250 mg by mouth daily.    Marland Kitchen LEVOTHYROXINE SODIUM 125 MCG PO TABS Oral Take 125 mcg by mouth daily.    Marland Kitchen METFORMIN HCL 500 MG PO TABS Oral Take 500 mg by mouth 2 (two) times daily.    .  ASPIRIN-ACETAMINOPHEN-CAFFEINE 250-250-65 MG PO TABS Oral Take 1 tablet by mouth every 6 (six) hours as needed. For migraine    . BUTALBITAL-ASA-CAFF-CODEINE 50-325-40-30 MG PO CAPS Oral Take 1 capsule by mouth every 4 (four) hours as needed. For migraines      BP 123/67  Pulse 84  Temp 97.7 F (36.5 C) (Oral)  Resp 20  Ht 5\' 7"  (1.702 m)  Wt 165 lb (74.844 kg)  BMI 25.84 kg/m2  SpO2 98%  LMP 06/30/2012  Physical Exam  Vitals reviewed. Constitutional: She is oriented to person, place, and time. She appears well-developed and well-nourished.  HENT:  Head: Normocephalic and atraumatic.  Right Ear: External ear normal.  Left Ear: External ear normal.  Nose: Nose normal.  Mouth/Throat: Oropharynx is clear and moist.  Eyes: Conjunctivae and EOM are normal. Pupils are equal, round, and reactive to light.  Neck: Normal range of motion.  Cardiovascular: Normal rate, regular rhythm and normal heart sounds.   Pulmonary/Chest: Effort normal and breath sounds normal.  Abdominal: Soft.  Musculoskeletal: Normal range of motion.  Neurological: She is alert and oriented to person, place, and time. She has normal reflexes.  Skin: Skin is warm.  Psychiatric: She has a normal mood and affect.    ED Course  Procedures (including critical care time)   Labs Reviewed  URINALYSIS, ROUTINE W REFLEX MICROSCOPIC   No results found.   No diagnosis found.    MDM  levaquin and albuterol   See your Physicain for recheck in 3-4 days for recheck        Lonia Skinner [place], [place] 07/21/12 1610

## 2012-07-21 NOTE — ED Provider Notes (Signed)
Medical screening examination/treatment/procedure(s) were performed by non-physician practitioner and as supervising physician I was immediately available for consultation/collaboration.   Shalaya Swailes, MD 07/21/12 1836 

## 2012-07-21 NOTE — ED Notes (Signed)
rx x 2 given for levaquin and albuterol inhaler

## 2012-11-04 ENCOUNTER — Encounter (HOSPITAL_BASED_OUTPATIENT_CLINIC_OR_DEPARTMENT_OTHER): Payer: Self-pay | Admitting: *Deleted

## 2012-11-04 ENCOUNTER — Emergency Department (HOSPITAL_BASED_OUTPATIENT_CLINIC_OR_DEPARTMENT_OTHER): Payer: Federal, State, Local not specified - PPO

## 2012-11-04 ENCOUNTER — Emergency Department (HOSPITAL_BASED_OUTPATIENT_CLINIC_OR_DEPARTMENT_OTHER)
Admission: EM | Admit: 2012-11-04 | Discharge: 2012-11-05 | Disposition: A | Discharge status: Home / Self Care | Payer: Federal, State, Local not specified - PPO | Attending: Emergency Medicine | Admitting: Emergency Medicine

## 2012-11-04 DIAGNOSIS — Z862 Personal history of diseases of the blood and blood-forming organs and certain disorders involving the immune mechanism: Secondary | ICD-10-CM | POA: Insufficient documentation

## 2012-11-04 DIAGNOSIS — Z7982 Long term (current) use of aspirin: Secondary | ICD-10-CM | POA: Insufficient documentation

## 2012-11-04 DIAGNOSIS — J329 Chronic sinusitis, unspecified: Secondary | ICD-10-CM | POA: Insufficient documentation

## 2012-11-04 DIAGNOSIS — R062 Wheezing: Secondary | ICD-10-CM | POA: Insufficient documentation

## 2012-11-04 DIAGNOSIS — Z9884 Bariatric surgery status: Secondary | ICD-10-CM | POA: Insufficient documentation

## 2012-11-04 DIAGNOSIS — Z8639 Personal history of other endocrine, nutritional and metabolic disease: Secondary | ICD-10-CM | POA: Insufficient documentation

## 2012-11-04 DIAGNOSIS — J45909 Unspecified asthma, uncomplicated: Secondary | ICD-10-CM | POA: Insufficient documentation

## 2012-11-04 DIAGNOSIS — Z87891 Personal history of nicotine dependence: Secondary | ICD-10-CM | POA: Insufficient documentation

## 2012-11-04 DIAGNOSIS — Z8679 Personal history of other diseases of the circulatory system: Secondary | ICD-10-CM | POA: Insufficient documentation

## 2012-11-04 DIAGNOSIS — E079 Disorder of thyroid, unspecified: Secondary | ICD-10-CM | POA: Insufficient documentation

## 2012-11-04 DIAGNOSIS — Z79899 Other long term (current) drug therapy: Secondary | ICD-10-CM | POA: Insufficient documentation

## 2012-11-04 HISTORY — DX: Migraine, unspecified, not intractable, without status migrainosus: G43.909

## 2012-11-04 LAB — URINALYSIS, ROUTINE W REFLEX MICROSCOPIC
Leukocytes, UA: NEGATIVE
Nitrite: NEGATIVE
Specific Gravity, Urine: 1.008 (ref 1.005–1.030)
Urobilinogen, UA: 0.2 mg/dL (ref 0.0–1.0)

## 2012-11-04 LAB — URINE MICROSCOPIC-ADD ON

## 2012-11-04 MED ORDER — AZITHROMYCIN 250 MG PO TABS: ORAL_TABLET | ORAL | Status: DC

## 2012-11-04 MED ORDER — LEVOFLOXACIN 500 MG PO TABS: 250.0000 mg | ORAL_TABLET | Freq: Every day | ORAL | Status: DC

## 2012-11-04 MED ORDER — FLUTICASONE PROPIONATE 50 MCG/ACT NA SUSP: 2.0000 | Freq: Every day | NASAL | Status: DC

## 2012-11-04 NOTE — ED Notes (Signed)
Registration called back to triage to let us know patient was in respiratory distress. Patient did not appear in any distress, I took vials showing pulse ox at 100 % room air. Patient stated she had a tightness in chest from coughing.

## 2012-11-04 NOTE — ED Notes (Signed)
MD at bedside giving results and plan of care. 

## 2012-11-04 NOTE — ED Provider Notes (Signed)
History     CSN: [id number]  Arrival date & time 11/04/12  1939   First MD Initiated Contact with Patient 11/04/12 2301      Chief Complaint  Patient presents with  . Shortness of Breath    (Consider location/radiation/quality/duration/timing/severity/associated sxs/prior treatment) Patient is a 41 y.o. female presenting with cough. The history is provided by the patient. No language interpreter was used.  Cough This is a new problem. The current episode started 12 to 24 hours ago. The problem occurs constantly. The problem has not changed since onset.The cough is productive of sputum. There has been no fever. Associated symptoms include ear pain and wheezing. Pertinent negatives include no chest pain, no chills, no sweats and no weight loss. She has tried nothing for the symptoms. The treatment provided no relief. She is not a smoker. Her past medical history does not include pneumonia.  PERC and wells negative  Past Medical History  Diagnosis Date  . Thyroid disease   . Asthma   . PCOS (polycystic ovarian syndrome)   . Migraine     Past Surgical History  Procedure Date  . Gastric bypass   . Thyroidectomy, partial   . Abdominal surgery     No family history on file.  History  Substance Use Topics  . Smoking status: Former Games developer  . Smokeless tobacco: Not on file  . Alcohol Use: No    OB History    Grav Para Term Preterm Abortions TAB SAB Ect Mult Living                  Review of Systems  Constitutional: Negative for fever, chills and weight loss.  HENT: Positive for ear pain and congestion.   Respiratory: Positive for cough and wheezing.   Cardiovascular: Negative for chest pain, palpitations and leg swelling.  All other systems reviewed and are negative.    Allergies  Penicillins; Iodine; and Sulfa antibiotics  Home Medications   Current Outpatient Rx  Name  Route  Sig  Dispense  Refill  . ALBUTEROL SULFATE HFA 108 (90 BASE) MCG/ACT IN AERS  Inhalation   Inhale 4 puffs into the lungs every 6 (six) hours as needed. For wheezing or shortness of breath         . ALBUTEROL SULFATE HFA 108 (90 BASE) MCG/ACT IN AERS   Inhalation   Inhale 2 puffs into the lungs every 4 (four) hours as needed for wheezing.   1 Inhaler   0   . ASPIRIN-ACETAMINOPHEN-CAFFEINE 250-250-65 MG PO TABS   Oral   Take 1 tablet by mouth every 6 (six) hours as needed. For migraine         . AZITHROMYCIN 250 MG PO TABS   Oral   Take 250 mg by mouth daily.         Marland Kitchen BUTALBITAL-ASA-CAFF-CODEINE 50-325-40-30 MG PO CAPS   Oral   Take 1 capsule by mouth every 4 (four) hours as needed. For migraines         . LEVOTHYROXINE SODIUM 125 MCG PO TABS   Oral   Take 125 mcg by mouth daily.         Marland Kitchen METFORMIN HCL 500 MG PO TABS   Oral   Take 500 mg by mouth 2 (two) times daily.           Pulse 79  Temp 97.6 F (36.4 C) (Oral)  Resp 20  Ht 5\' 7"  (1.702 m)  Wt 165 lb (74.844 kg)  BMI 25.84 kg/m2  SpO2 100%  LMP 10/25/2012  Physical Exam  Constitutional: She is oriented to person, place, and time. She appears well-developed and well-nourished. No distress.  HENT:  Head: Normocephalic and atraumatic.  Mouth/Throat: Oropharynx is clear and moist.       Cobblestoning with drainage consistent with post nasal drip  Eyes: Conjunctivae normal are normal. Pupils are equal, round, and reactive to light.  Neck: Normal range of motion. Neck supple.  Cardiovascular: Normal rate, regular rhythm and intact distal pulses.   Pulmonary/Chest: Effort normal and breath sounds normal. No stridor. She has no wheezes. She has no rales.  Abdominal: Soft. Bowel sounds are normal. There is no tenderness. There is no rebound and no guarding.  Musculoskeletal: Normal range of motion.  Neurological: She is alert and oriented to person, place, and time.  Skin: Skin is warm and dry.  Psychiatric: She has a normal mood and affect.    ED Course  Procedures (including  critical care time)  Labs Reviewed  URINALYSIS, ROUTINE W REFLEX MICROSCOPIC - Abnormal; Notable for the following:    Hgb urine dipstick SMALL (*)     All other components within normal limits  URINE MICROSCOPIC-ADD ON - Abnormal; Notable for the following:    Squamous Epithelial / LPF FEW (*)     Bacteria, UA FEW (*)     All other components within normal limits   No results found.   No diagnosis found.    MDM  Follow up with your family doctor for ongoing care return for worsening symptoms of any kind.  Patient verbalizes understanding and agrees to follow up        Jeffrie Stander K Latunya Kissick-Rasch, MD 11/04/12 2343

## 2012-11-04 NOTE — ED Notes (Addendum)
C/o feeling short of breath and dry cough that started today. Denies any fevers. Tightness in ant chest with inspiration. Denies any n/v.  Also, c/o burning with urination. C/o frequency. No wheezing at present.

## 2013-03-19 ENCOUNTER — Other Ambulatory Visit: Payer: Self-pay

## 2013-03-19 DIAGNOSIS — Z1231 Encounter for screening mammogram for malignant neoplasm of breast: Secondary | ICD-10-CM

## 2013-04-11 ENCOUNTER — Ambulatory Visit: Payer: Federal, State, Local not specified - PPO

## 2013-05-18 ENCOUNTER — Emergency Department (HOSPITAL_BASED_OUTPATIENT_CLINIC_OR_DEPARTMENT_OTHER)
Admission: EM | Admit: 2013-05-18 | Discharge: 2013-05-18 | Disposition: A | Discharge status: Home / Self Care | Payer: Federal, State, Local not specified - PPO | Attending: Emergency Medicine | Admitting: Emergency Medicine

## 2013-05-18 ENCOUNTER — Encounter (HOSPITAL_BASED_OUTPATIENT_CLINIC_OR_DEPARTMENT_OTHER): Payer: Self-pay | Admitting: Radiology

## 2013-05-18 DIAGNOSIS — R093 Abnormal sputum: Secondary | ICD-10-CM | POA: Insufficient documentation

## 2013-05-18 DIAGNOSIS — Z880 Allergy status to penicillin: Secondary | ICD-10-CM | POA: Insufficient documentation

## 2013-05-18 DIAGNOSIS — H9209 Otalgia, unspecified ear: Secondary | ICD-10-CM | POA: Insufficient documentation

## 2013-05-18 DIAGNOSIS — E079 Disorder of thyroid, unspecified: Secondary | ICD-10-CM | POA: Insufficient documentation

## 2013-05-18 DIAGNOSIS — IMO0002 Reserved for concepts with insufficient information to code with codable children: Secondary | ICD-10-CM | POA: Insufficient documentation

## 2013-05-18 DIAGNOSIS — G43909 Migraine, unspecified, not intractable, without status migrainosus: Secondary | ICD-10-CM | POA: Insufficient documentation

## 2013-05-18 DIAGNOSIS — R05 Cough: Secondary | ICD-10-CM | POA: Insufficient documentation

## 2013-05-18 DIAGNOSIS — J40 Bronchitis, not specified as acute or chronic: Secondary | ICD-10-CM

## 2013-05-18 DIAGNOSIS — Z3202 Encounter for pregnancy test, result negative: Secondary | ICD-10-CM | POA: Insufficient documentation

## 2013-05-18 DIAGNOSIS — R51 Headache: Secondary | ICD-10-CM | POA: Insufficient documentation

## 2013-05-18 DIAGNOSIS — Z8739 Personal history of other diseases of the musculoskeletal system and connective tissue: Secondary | ICD-10-CM | POA: Insufficient documentation

## 2013-05-18 DIAGNOSIS — R350 Frequency of micturition: Secondary | ICD-10-CM | POA: Insufficient documentation

## 2013-05-18 DIAGNOSIS — Z9884 Bariatric surgery status: Secondary | ICD-10-CM | POA: Insufficient documentation

## 2013-05-18 DIAGNOSIS — R300 Dysuria: Secondary | ICD-10-CM | POA: Insufficient documentation

## 2013-05-18 DIAGNOSIS — H748X9 Other specified disorders of middle ear and mastoid, unspecified ear: Secondary | ICD-10-CM | POA: Insufficient documentation

## 2013-05-18 DIAGNOSIS — J45901 Unspecified asthma with (acute) exacerbation: Secondary | ICD-10-CM | POA: Insufficient documentation

## 2013-05-18 DIAGNOSIS — J029 Acute pharyngitis, unspecified: Secondary | ICD-10-CM | POA: Insufficient documentation

## 2013-05-18 DIAGNOSIS — Z79899 Other long term (current) drug therapy: Secondary | ICD-10-CM | POA: Insufficient documentation

## 2013-05-18 DIAGNOSIS — R059 Cough, unspecified: Secondary | ICD-10-CM | POA: Insufficient documentation

## 2013-05-18 DIAGNOSIS — Z792 Long term (current) use of antibiotics: Secondary | ICD-10-CM | POA: Insufficient documentation

## 2013-05-18 DIAGNOSIS — R42 Dizziness and giddiness: Secondary | ICD-10-CM | POA: Insufficient documentation

## 2013-05-18 LAB — URINALYSIS W MICROSCOPIC + REFLEX CULTURE
Bilirubin Urine: NEGATIVE
Hgb urine dipstick: NEGATIVE
Protein, ur: NEGATIVE mg/dL
Urobilinogen, UA: 0.2 mg/dL (ref 0.0–1.0)

## 2013-05-18 MED ORDER — LEVOFLOXACIN 500 MG PO TABS: 500.0000 mg | ORAL_TABLET | Freq: Every day | ORAL | Status: DC

## 2013-05-18 MED ORDER — ALBUTEROL SULFATE HFA 108 (90 BASE) MCG/ACT IN AERS: 1.0000 | INHALATION_SPRAY | Freq: Four times a day (QID) | RESPIRATORY_TRACT | Status: DC | PRN

## 2013-05-18 NOTE — ED Provider Notes (Signed)
Medical screening examination/treatment/procedure(s) were performed by non-physician practitioner and as supervising physician I was immediately available for consultation/collaboration.  Natali Lavallee, MD 05/18/13 1639 

## 2013-05-18 NOTE — ED Notes (Signed)
Pt presents with "upper respiratory infection" "migraine" "burning when I pee"

## 2013-05-18 NOTE — ED Provider Notes (Signed)
History    CSN: [id number] Arrival date & time 05/18/13  1232  First MD Initiated Contact with Patient 05/18/13 1309     Chief Complaint  Patient presents with  . Nasal Congestion  . Dizziness  . Urinary Frequency   (Consider location/radiation/quality/duration/timing/severity/associated sxs/prior Treatment) HPI Kathleen Ware is a 42 y.o. female who presents to ED with 2 separate complaints, burning with urination for about 2 wks, and cough for about a month. Pt states she is having pain when she is urinating, denies urinary frequency or urgency, no blood in urine. No fever, abdominal pain, flank pain. States also having itching in ears, cough with yellow productive sputum. States she was started on zithromax when cough started, states finished it, but no improvement in symptoms. States she has hx of asthma, she has been taking her inhalers with no relief. States using flonase for congestion, but states that is "not an issue" at this time. Does however, report right sinus tenderness. Pt states "when it gets this bad, levaquin knocks it out."   Past Medical History  Diagnosis Date  . Thyroid disease   . Asthma   . PCOS (polycystic ovarian syndrome)   . Migraine    Past Surgical History  Procedure Laterality Date  . Gastric bypass    . Thyroidectomy, partial    . Abdominal surgery     History reviewed. No pertinent family history. History  Substance Use Topics  . Smoking status: Former Games developer  . Smokeless tobacco: Not on file  . Alcohol Use: No   OB History   Grav Para Term Preterm Abortions TAB SAB Ect Mult Living                 Review of Systems  Constitutional: Negative for fever and chills.  HENT: Positive for ear pain, sore throat and sinus pressure. Negative for congestion, neck pain and neck stiffness.   Respiratory: Positive for cough. Negative for chest tightness, shortness of breath and wheezing.   Cardiovascular: Negative.   Gastrointestinal: Negative  for nausea, vomiting and abdominal pain.  Genitourinary: Positive for dysuria. Negative for frequency, hematuria, flank pain, vaginal bleeding, vaginal discharge, vaginal pain and pelvic pain.  Musculoskeletal: Negative for back pain.  Skin: Negative.   Neurological: Positive for headaches. Negative for dizziness, weakness and light-headedness.    Allergies  Penicillins; Iodine; and Sulfa antibiotics  Home Medications   Current Outpatient Rx  Name  Route  Sig  Dispense  Refill  . albuterol (PROVENTIL HFA;VENTOLIN HFA) 108 (90 BASE) MCG/ACT inhaler   Inhalation   Inhale 4 puffs into the lungs every 6 (six) hours as needed. For wheezing or shortness of breath         . albuterol (PROVENTIL HFA;VENTOLIN HFA) 108 (90 BASE) MCG/ACT inhaler   Inhalation   Inhale 2 puffs into the lungs every 4 (four) hours as needed for wheezing.   1 Inhaler   0   . aspirin-acetaminophen-caffeine (EXCEDRIN MIGRAINE) 250-250-65 MG per tablet   Oral   Take 1 tablet by mouth every 6 (six) hours as needed. For migraine         . azithromycin (ZITHROMAX) 250 MG tablet   Oral   Take 250 mg by mouth daily.         . butalbital-aspirin-caffeine-codeine (FIORINAL WITH CODEINE) 50-325-40-30 MG capsule   Oral   Take 1 capsule by mouth every 4 (four) hours as needed. For migraines         . fluticasone (  FLONASE) 50 MCG/ACT nasal spray   Nasal   Place 2 sprays into the nose daily.   16 g   0   . levofloxacin (LEVAQUIN) 500 MG tablet   Oral   Take 0.5 tablets (250 mg total) by mouth daily.   5 tablet   0   . levothyroxine (SYNTHROID, LEVOTHROID) 125 MCG tablet   Oral   Take 125 mcg by mouth daily.         . metFORMIN (GLUCOPHAGE) 500 MG tablet   Oral   Take 500 mg by mouth 2 (two) times daily.          BP 97/66  Pulse 98  Temp(Src) 98 F (36.7 C) (Oral)  Resp 20  Ht 5\' 7"  (1.702 m)  Wt 165 lb (74.844 kg)  BMI 25.84 kg/m2  SpO2 99% Physical Exam  Nursing note and vitals  reviewed. Constitutional: She is oriented to person, place, and time. She appears well-developed and well-nourished. No distress.  HENT:  Head: Normocephalic and atraumatic.  Right Ear: Tympanic membrane, external ear and ear canal normal.  Left Ear: Tympanic membrane, external ear and ear canal normal.  Nose: Right sinus exhibits maxillary sinus tenderness.  Mouth/Throat: Uvula is midline, oropharynx is clear and moist and mucous membranes are normal.  Eyes: Conjunctivae are normal.  Neck: Neck supple.  Cardiovascular: Normal rate, regular rhythm and normal heart sounds.   Pulmonary/Chest: Effort normal and breath sounds normal. No respiratory distress. She has no wheezes. She has no rales.  Abdominal: Soft. Bowel sounds are normal. She exhibits no distension. There is no tenderness. There is no rebound.  Musculoskeletal: She exhibits no edema.  Neurological: She is alert and oriented to person, place, and time.  Skin: Skin is warm and dry.    ED Course  Procedures (including critical care time) Results for orders placed during the hospital encounter of 05/18/13  URINALYSIS W MICROSCOPIC + REFLEX CULTURE      Result Value Range   Color, Urine YELLOW  YELLOW   APPearance CLOUDY (*) CLEAR   Specific Gravity, Urine 1.021  1.005 - 1.030   pH 5.5  5.0 - 8.0   Glucose, UA NEGATIVE  NEGATIVE mg/dL   Hgb urine dipstick NEGATIVE  NEGATIVE   Bilirubin Urine NEGATIVE  NEGATIVE   Ketones, ur NEGATIVE  NEGATIVE mg/dL   Protein, ur NEGATIVE  NEGATIVE mg/dL   Urobilinogen, UA 0.2  0.0 - 1.0 mg/dL   Nitrite NEGATIVE  NEGATIVE   Leukocytes, UA NEGATIVE  NEGATIVE   Bacteria, UA MANY (*) RARE   Squamous Epithelial / LPF MANY (*) RARE   Casts HYALINE CASTS (*) NEGATIVE  PREGNANCY, URINE      Result Value Range   Preg Test, Ur NEGATIVE  NEGATIVE   No results found.   1. Bronchitis     MDM  Pt with cough, urinary symptoms. UA here contaminated. Her VS are normal. Cough is productive with  yellow sputum almost for a month now. She failed tx with zithromax. Pt requesting levaquin. Will try levaquin and inhaler for bronchitis. Doubt pneumonia at this time given afebrile, lungs clear, oxygen sat 99% on RA.   Filed Vitals:   05/18/13 1249  BP: 97/66  Pulse: 98  Temp: 98 F (36.7 C)  TempSrc: Oral  Resp: 20  Height: 5\' 7"  (1.702 m)  Weight: 165 lb (74.844 kg)  SpO2: 99%     Lottie Mussel, PA-C 05/18/13 1448

## 2013-06-10 ENCOUNTER — Emergency Department (HOSPITAL_BASED_OUTPATIENT_CLINIC_OR_DEPARTMENT_OTHER)
Admission: EM | Admit: 2013-06-10 | Discharge: 2013-06-10 | Disposition: A | Discharge status: Home / Self Care | Payer: Federal, State, Local not specified - PPO | Attending: Emergency Medicine | Admitting: Emergency Medicine

## 2013-06-10 ENCOUNTER — Encounter (HOSPITAL_BASED_OUTPATIENT_CLINIC_OR_DEPARTMENT_OTHER): Payer: Self-pay | Admitting: *Deleted

## 2013-06-10 ENCOUNTER — Emergency Department (HOSPITAL_BASED_OUTPATIENT_CLINIC_OR_DEPARTMENT_OTHER): Payer: Federal, State, Local not specified - PPO

## 2013-06-10 DIAGNOSIS — Z9889 Other specified postprocedural states: Secondary | ICD-10-CM | POA: Insufficient documentation

## 2013-06-10 DIAGNOSIS — R5381 Other malaise: Secondary | ICD-10-CM | POA: Insufficient documentation

## 2013-06-10 DIAGNOSIS — R42 Dizziness and giddiness: Secondary | ICD-10-CM | POA: Insufficient documentation

## 2013-06-10 DIAGNOSIS — Z862 Personal history of diseases of the blood and blood-forming organs and certain disorders involving the immune mechanism: Secondary | ICD-10-CM | POA: Insufficient documentation

## 2013-06-10 DIAGNOSIS — G43909 Migraine, unspecified, not intractable, without status migrainosus: Secondary | ICD-10-CM | POA: Insufficient documentation

## 2013-06-10 DIAGNOSIS — Z792 Long term (current) use of antibiotics: Secondary | ICD-10-CM | POA: Insufficient documentation

## 2013-06-10 DIAGNOSIS — R300 Dysuria: Secondary | ICD-10-CM | POA: Insufficient documentation

## 2013-06-10 DIAGNOSIS — R6883 Chills (without fever): Secondary | ICD-10-CM | POA: Insufficient documentation

## 2013-06-10 DIAGNOSIS — Z8639 Personal history of other endocrine, nutritional and metabolic disease: Secondary | ICD-10-CM | POA: Insufficient documentation

## 2013-06-10 DIAGNOSIS — Z87891 Personal history of nicotine dependence: Secondary | ICD-10-CM | POA: Insufficient documentation

## 2013-06-10 DIAGNOSIS — Z79899 Other long term (current) drug therapy: Secondary | ICD-10-CM | POA: Insufficient documentation

## 2013-06-10 DIAGNOSIS — Z880 Allergy status to penicillin: Secondary | ICD-10-CM | POA: Insufficient documentation

## 2013-06-10 DIAGNOSIS — R5383 Other fatigue: Secondary | ICD-10-CM | POA: Insufficient documentation

## 2013-06-10 DIAGNOSIS — J069 Acute upper respiratory infection, unspecified: Secondary | ICD-10-CM | POA: Insufficient documentation

## 2013-06-10 DIAGNOSIS — Z3202 Encounter for pregnancy test, result negative: Secondary | ICD-10-CM | POA: Insufficient documentation

## 2013-06-10 DIAGNOSIS — R109 Unspecified abdominal pain: Secondary | ICD-10-CM | POA: Insufficient documentation

## 2013-06-10 DIAGNOSIS — R112 Nausea with vomiting, unspecified: Secondary | ICD-10-CM | POA: Insufficient documentation

## 2013-06-10 DIAGNOSIS — IMO0002 Reserved for concepts with insufficient information to code with codable children: Secondary | ICD-10-CM | POA: Insufficient documentation

## 2013-06-10 DIAGNOSIS — J45909 Unspecified asthma, uncomplicated: Secondary | ICD-10-CM | POA: Insufficient documentation

## 2013-06-10 DIAGNOSIS — IMO0001 Reserved for inherently not codable concepts without codable children: Secondary | ICD-10-CM | POA: Insufficient documentation

## 2013-06-10 DIAGNOSIS — E079 Disorder of thyroid, unspecified: Secondary | ICD-10-CM | POA: Insufficient documentation

## 2013-06-10 LAB — PREGNANCY, URINE: Preg Test, Ur: NEGATIVE

## 2013-06-10 LAB — URINALYSIS, ROUTINE W REFLEX MICROSCOPIC
Glucose, UA: NEGATIVE mg/dL
Leukocytes, UA: NEGATIVE
Protein, ur: NEGATIVE mg/dL
pH: 5.5 (ref 5.0–8.0)

## 2013-06-10 LAB — URINE MICROSCOPIC-ADD ON

## 2013-06-10 NOTE — ED Notes (Signed)
Patient with multiple complaints.  C/o cough, abd. Pain and dysuria.

## 2013-06-10 NOTE — ED Provider Notes (Signed)
History     This chart was scribed for Rolan Bucco, MD by Jiles Prows, ED Scribe. The patient was seen in room MH03/MH03 and the patient's care was started at 10:03 PM.  CSN: [id number] Arrival date & time 06/10/13  2129   Chief Complaint  Patient presents with  . Cough   Patient is a 42 y.o. female presenting with cough. The history is provided by the patient and medical records. No language interpreter was used.  Cough Associated symptoms: myalgias   Associated symptoms: no chest pain, no chills, no diaphoresis, no fever, no headaches, no rash, no rhinorrhea and no shortness of breath    HPI Comments: Kathleen Ware is a 42 y.o. female with a h/o thyroid disease, asthma, PCOS, and migraine who presents to the Emergency Department complaining of upset stomach, urinary symptoms, and migraine headache.  Pt reports that she has been sleeping 12 hours a day and cannot get out of bed.  She states she is feeling excessively tired, feeling chills, and body aches.  Pt complains of productive cough that produces yellow phlegm.  Pt was seen for these symptoms in June, and treated with antibiotics.  She reports that she improved 100%, but started feeling bad again about 2 days ago.  She states that her headache is similar to past headaches.   Pt denies diaphoresis, fever, chills, diarrhea, cough, SOB and any other pain.  Pt is very adamant that she needs Levaquin and antibiotics.    She reports that she had this same issue last year and was only improved upon treatment with antibiotics and Levaquin.    Past Medical History  Diagnosis Date  . Thyroid disease   . Asthma   . PCOS (polycystic ovarian syndrome)   . Migraine    Past Surgical History  Procedure Laterality Date  . Gastric bypass    . Thyroidectomy, partial    . Abdominal surgery     No family history on file. History  Substance Use Topics  . Smoking status: Former Games developer  . Smokeless tobacco: Not on file  . Alcohol Use: No    OB History   Grav Para Term Preterm Abortions TAB SAB Ect Mult Living                 Review of Systems  Constitutional: Positive for fatigue. Negative for fever, chills and diaphoresis.  HENT: Negative for congestion, rhinorrhea and sneezing.   Eyes: Negative.   Respiratory: Positive for cough. Negative for chest tightness and shortness of breath.   Cardiovascular: Negative for chest pain and leg swelling.  Gastrointestinal: Positive for nausea and vomiting. Negative for abdominal pain, diarrhea and blood in stool.  Genitourinary: Positive for dysuria. Negative for frequency, hematuria, flank pain, vaginal bleeding, vaginal discharge and difficulty urinating.  Musculoskeletal: Positive for myalgias. Negative for back pain and arthralgias.  Skin: Negative for rash.  Neurological: Positive for dizziness. Negative for speech difficulty, weakness, numbness and headaches.  All other systems reviewed and are negative.   Allergies  Penicillins; Iodine; and Sulfa antibiotics  Home Medications   Current Outpatient Rx  Name  Route  Sig  Dispense  Refill  . albuterol (PROVENTIL HFA;VENTOLIN HFA) 108 (90 BASE) MCG/ACT inhaler   Inhalation   Inhale 4 puffs into the lungs every 6 (six) hours as needed. For wheezing or shortness of breath         . albuterol (PROVENTIL HFA;VENTOLIN HFA) 108 (90 BASE) MCG/ACT inhaler   Inhalation  Inhale 2 puffs into the lungs every 4 (four) hours as needed for wheezing.   1 Inhaler   0   . albuterol (PROVENTIL HFA;VENTOLIN HFA) 108 (90 BASE) MCG/ACT inhaler   Inhalation   Inhale 1-2 puffs into the lungs every 6 (six) hours as needed for wheezing.   1 Inhaler   0   . aspirin-acetaminophen-caffeine (EXCEDRIN MIGRAINE) 250-250-65 MG per tablet   Oral   Take 1 tablet by mouth every 6 (six) hours as needed. For migraine         . azithromycin (ZITHROMAX) 250 MG tablet   Oral   Take 250 mg by mouth daily.         .  butalbital-aspirin-caffeine-codeine (FIORINAL WITH CODEINE) 50-325-40-30 MG capsule   Oral   Take 1 capsule by mouth every 4 (four) hours as needed. For migraines         . fluticasone (FLONASE) 50 MCG/ACT nasal spray   Nasal   Place 2 sprays into the nose daily.   16 g   0   . levofloxacin (LEVAQUIN) 500 MG tablet   Oral   Take 0.5 tablets (250 mg total) by mouth daily.   5 tablet   0   . levofloxacin (LEVAQUIN) 500 MG tablet   Oral   Take 1 tablet (500 mg total) by mouth daily.   7 tablet   0   . levothyroxine (SYNTHROID, LEVOTHROID) 125 MCG tablet   Oral   Take 125 mcg by mouth daily.         . metFORMIN (GLUCOPHAGE) 500 MG tablet   Oral   Take 500 mg by mouth 2 (two) times daily.          BP 116/73  Pulse 85  Temp(Src) 98.3 F (36.8 C) (Oral)  Resp 16  Wt 170 lb (77.111 kg)  BMI 26.62 kg/m2  SpO2 96% Physical Exam  Constitutional: She is oriented to person, place, and time. She appears well-developed and well-nourished.  HENT:  Head: Normocephalic and atraumatic.  Eyes: Pupils are equal, round, and reactive to light.  Neck: Normal range of motion. Neck supple.  Cardiovascular: Normal rate, regular rhythm and normal heart sounds.   Pulmonary/Chest: Effort normal and breath sounds normal. No respiratory distress. She has no wheezes. She has no rales. She exhibits no tenderness.  Abdominal: Soft. Bowel sounds are normal. There is tenderness (Mild tenderness across her lower abdomen bilaterally). There is no rebound and no guarding.  Musculoskeletal: Normal range of motion. She exhibits no edema.  Lymphadenopathy:    She has no cervical adenopathy.  Neurological: She is alert and oriented to person, place, and time.  Skin: Skin is warm and dry. No rash noted.  Psychiatric: She has a normal mood and affect.    ED Course  Procedures (including critical care time) DIAGNOSTIC STUDIES: Oxygen Saturation is 96% on RA, adequate by my interpretation.     COORDINATION OF CARE: 10:07 PM - Discussed ED treatment with pt at bedside including urinalysis and chest x-ray and pt agrees.  Pt is exasperated when she is denied antibiotics.  She keeps repeating, "I am in a lot of pain.  I know my own body."    Results for orders placed during the hospital encounter of 06/10/13  URINALYSIS, ROUTINE W REFLEX MICROSCOPIC      Result Value Range   Color, Urine YELLOW  YELLOW   APPearance CLOUDY (*) CLEAR   Specific Gravity, Urine 1.008  1.005 - 1.030  pH 5.5  5.0 - 8.0   Glucose, UA NEGATIVE  NEGATIVE mg/dL   Hgb urine dipstick TRACE (*) NEGATIVE   Bilirubin Urine NEGATIVE  NEGATIVE   Ketones, ur NEGATIVE  NEGATIVE mg/dL   Protein, ur NEGATIVE  NEGATIVE mg/dL   Urobilinogen, UA 0.2  0.0 - 1.0 mg/dL   Nitrite NEGATIVE  NEGATIVE   Leukocytes, UA NEGATIVE  NEGATIVE  PREGNANCY, URINE      Result Value Range   Preg Test, Ur NEGATIVE  NEGATIVE  URINE MICROSCOPIC-ADD ON      Result Value Range   Squamous Epithelial / LPF FEW (*) RARE   RBC / HPF 0-2  <3 RBC/hpf   Bacteria, UA RARE  RARE   Dg Chest 2 View  06/10/2013   *RADIOLOGY REPORT*  Clinical Data: Cough  CHEST - 2 VIEW  Comparison:  November 04, 2012  Findings:  Lungs clear.  Heart size and pulmonary vascularity are normal.  No adenopathy.  No bone lesions.  IMPRESSION: No abnormality noted.   Original Report Authenticated By: Bretta Bang, M.D.     Dg Chest 2 View  06/10/2013   *RADIOLOGY REPORT*  Clinical Data: Cough  CHEST - 2 VIEW  Comparison:  November 04, 2012  Findings:  Lungs clear.  Heart size and pulmonary vascularity are normal.  No adenopathy.  No bone lesions.  IMPRESSION: No abnormality noted.   Original Report Authenticated By: Bretta Bang, M.D.   1. Dysuria   2. URI (upper respiratory infection)     MDM  Patient presents with cough and cold symptoms as well as dysuria. She denies any vaginal complaints. She was seen here at the end of June for the same complaints  and was adamant about getting Levaquin at that time. She did take a course of Levaquin after she Re: finished a course of Zithromax and states that she got completely better with that. She's now here with the same symptoms. Her urine did not show any evidence of infection. I will send it for culture given her urinary complaints. Her chest x-ray did not demonstrate pneumonia. She is well-appearing and has had no vomiting here in the ED. I have not heard any coughing in the ED. Patient is very adamant about needing antibiotics. I am reluctant to prescribe antibiotics at this point without a source of infection especially given that she just finished a course of Zithromax and Levaquin within the last 3 days. I did try to explain this to the patient that she was very upset and states that she was going to go directly to [facility] regional ED. I advised her that we will do a urine culture and she should followup with her primary care physician tomorrow if she still complained of similar symptoms. I personally performed the services described in this documentation, which was scribed in my presence.  The recorded information has been reviewed and considered.    Rolan Bucco, MD 06/10/13 2303

## 2013-06-10 NOTE — ED Notes (Signed)
Cough. Dizziness. Dysuria.

## 2013-07-02 ENCOUNTER — Ambulatory Visit: Payer: Federal, State, Local not specified - PPO | Admitting: Physician Assistant

## 2013-07-03 ENCOUNTER — Encounter: Payer: Self-pay | Admitting: Physician Assistant

## 2013-07-03 ENCOUNTER — Ambulatory Visit (HOSPITAL_BASED_OUTPATIENT_CLINIC_OR_DEPARTMENT_OTHER)
Admission: RE | Admit: 2013-07-03 | Discharge: 2013-07-03 | Disposition: A | Discharge status: Home / Self Care | Payer: Federal, State, Local not specified - PPO | Source: Ambulatory Visit | Attending: Physician Assistant | Admitting: Physician Assistant

## 2013-07-03 ENCOUNTER — Ambulatory Visit (INDEPENDENT_AMBULATORY_CARE_PROVIDER_SITE_OTHER): Payer: Federal, State, Local not specified - PPO | Admitting: Physician Assistant

## 2013-07-03 VITALS — BP 104/72 | HR 86 | Temp 98.4°F | Resp 20 | Ht 67.0 in | Wt 192.0 lb

## 2013-07-03 DIAGNOSIS — R109 Unspecified abdominal pain: Secondary | ICD-10-CM | POA: Insufficient documentation

## 2013-07-03 DIAGNOSIS — G43909 Migraine, unspecified, not intractable, without status migrainosus: Secondary | ICD-10-CM

## 2013-07-03 DIAGNOSIS — J329 Chronic sinusitis, unspecified: Secondary | ICD-10-CM | POA: Insufficient documentation

## 2013-07-03 DIAGNOSIS — R319 Hematuria, unspecified: Secondary | ICD-10-CM

## 2013-07-03 DIAGNOSIS — F411 Generalized anxiety disorder: Secondary | ICD-10-CM

## 2013-07-03 DIAGNOSIS — Z0000 Encounter for general adult medical examination without abnormal findings: Secondary | ICD-10-CM

## 2013-07-03 DIAGNOSIS — E282 Polycystic ovarian syndrome: Secondary | ICD-10-CM

## 2013-07-03 DIAGNOSIS — R39198 Other difficulties with micturition: Secondary | ICD-10-CM

## 2013-07-03 DIAGNOSIS — R300 Dysuria: Secondary | ICD-10-CM

## 2013-07-03 DIAGNOSIS — E039 Hypothyroidism, unspecified: Secondary | ICD-10-CM

## 2013-07-03 DIAGNOSIS — G4700 Insomnia, unspecified: Secondary | ICD-10-CM

## 2013-07-03 DIAGNOSIS — R309 Painful micturition, unspecified: Secondary | ICD-10-CM

## 2013-07-03 DIAGNOSIS — R3915 Urgency of urination: Secondary | ICD-10-CM

## 2013-07-03 DIAGNOSIS — J309 Allergic rhinitis, unspecified: Secondary | ICD-10-CM

## 2013-07-03 DIAGNOSIS — F419 Anxiety disorder, unspecified: Secondary | ICD-10-CM

## 2013-07-03 DIAGNOSIS — R3919 Other difficulties with micturition: Secondary | ICD-10-CM

## 2013-07-03 LAB — POCT URINALYSIS DIPSTICK
Bilirubin, UA: NEGATIVE
Glucose, UA: NEGATIVE
Ketones, UA: NEGATIVE
Leukocytes, UA: NEGATIVE
Nitrite, UA: NEGATIVE
Protein, UA: NEGATIVE
Spec Grav, UA: 1.025
Urobilinogen, UA: 0.2
pH, UA: 6

## 2013-07-03 MED ORDER — BUTALBITAL-ASA-CAFF-CODEINE 50-325-40-30 MG PO CAPS: 1.0000 | ORAL_CAPSULE | ORAL | Status: DC | PRN

## 2013-07-03 MED ORDER — FLUTICASONE PROPIONATE 50 MCG/ACT NA SUSP: 2.0000 | Freq: Every day | NASAL | Status: DC

## 2013-07-03 MED ORDER — METFORMIN HCL 500 MG PO TABS: 500.0000 mg | ORAL_TABLET | Freq: Two times a day (BID) | ORAL | Status: DC

## 2013-07-03 MED ORDER — ZOLPIDEM TARTRATE 5 MG PO TABS: 5.0000 mg | ORAL_TABLET | Freq: Every evening | ORAL | Status: DC | PRN

## 2013-07-03 MED ORDER — MOXIFLOXACIN HCL 400 MG PO TABS: 400.0000 mg | ORAL_TABLET | Freq: Every day | ORAL | Status: DC

## 2013-07-03 NOTE — Assessment & Plan Note (Signed)
Patient endorse history of hypothyroidism.  Will obtain TSH.  Patient informed that medication will not be refilled until she presents for lab work

## 2013-07-03 NOTE — Assessment & Plan Note (Signed)
Refill of Metformin.  Referral to OB/GYN.

## 2013-07-03 NOTE — Assessment & Plan Note (Signed)
Patient to return this week for lab work.  Wants to come in fasting so she can have cholesterol checked as well.  Has fear of needles.  Referral to OB/GYN made for PCOS and for PAP smear.  Mammogram ordered.

## 2013-07-03 NOTE — Patient Instructions (Signed)
Please obtain labs later this week.  I will not refill medications without labs.  I will make a referral for you to gynecology.  I will also make a referral for you to see a counselor regarding anxiety.  Anxiety and Panic Attacks Your caregiver has informed you that you are having an anxiety or panic attack. There may be many forms of this. Most of the time these attacks come suddenly and without warning. They come at any time of day, including periods of sleep, and at any time of life. They may be strong and unexplained. Although panic attacks are very scary, they are physically harmless. Sometimes the cause of your anxiety is not known. Anxiety is a protective mechanism of the body in its fight or flight mechanism. Most of these perceived danger situations are actually nonphysical situations (such as anxiety over losing a job). CAUSES  The causes of an anxiety or panic attack are many. Panic attacks may occur in otherwise healthy people given a certain set of circumstances. There may be a genetic cause for panic attacks. Some medications may also have anxiety as a side effect. SYMPTOMS  Some of the most common feelings are:  Intense terror.  Dizziness, feeling faint.  Hot and cold flashes.  Fear of going crazy.  Feelings that nothing is real.  Sweating.  Shaking.  Chest pain or a fast heartbeat (palpitations).  Smothering, choking sensations.  Feelings of impending doom and that death is near.  Tingling of extremities, this may be from over-breathing.  Altered reality (derealization).  Being detached from yourself (depersonalization). Several symptoms can be present to make up anxiety or panic attacks. DIAGNOSIS  The evaluation by your caregiver will depend on the type of symptoms you are experiencing. The diagnosis of anxiety or panic attack is made when no physical illness can be determined to be a cause of the symptoms. TREATMENT  Treatment to prevent anxiety and panic  attacks may include:  Avoidance of circumstances that cause anxiety.  Reassurance and relaxation.  Regular exercise.  Relaxation therapies, such as yoga.  Psychotherapy with a psychiatrist or therapist.  Avoidance of caffeine, alcohol and illegal drugs.  Prescribed medication. SEEK IMMEDIATE MEDICAL CARE IF:   You experience panic attack symptoms that are different than your usual symptoms.  You have any worsening or concerning symptoms. Document Released: 11/07/2005 Document Revised: 01/30/2012 Document Reviewed: 03/11/2010 [employer] Patient Information 2014 [place], [place].

## 2013-07-03 NOTE — Assessment & Plan Note (Signed)
Rx for Avelox.  Referral to ENT for recurrent sinusitis.  Encourage rest/fluids.  Daily claritin.  Fluticasone nasal spray.
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
## 2013-07-03 NOTE — Assessment & Plan Note (Signed)
Refill for Ambien with no refills.  Patient states she has a therapist but I am unsure of the validity of this.  Referral to psychiatry made.  Patient encouraged to go.

## 2013-07-03 NOTE — Assessment & Plan Note (Signed)
Rx refill for Fiornal.

## 2013-07-03 NOTE — Assessment & Plan Note (Signed)
Urine dipstick with blood but no evidence of infection.  Urine sent for urinalysis, microscopy and culture.  KUB obtained to rule out stone.  KUB with no abnormal findings.  Patient endorses hx of hematuria with prior Urology work-up.  Urology referral may be needed.

## 2013-07-04 LAB — URINALYSIS, ROUTINE W REFLEX MICROSCOPIC
Ketones, ur: NEGATIVE mg/dL
Leukocytes, UA: NEGATIVE
Nitrite: NEGATIVE
Specific Gravity, Urine: 1.022 (ref 1.005–1.030)
pH: 5.5 (ref 5.0–8.0)

## 2013-07-04 LAB — URINALYSIS, MICROSCOPIC ONLY

## 2013-07-05 LAB — URINE CULTURE

## 2013-07-10 ENCOUNTER — Encounter: Payer: Self-pay | Admitting: *Deleted

## 2013-07-12 ENCOUNTER — Telehealth: Payer: Self-pay | Admitting: *Deleted

## 2013-07-12 DIAGNOSIS — E282 Polycystic ovarian syndrome: Secondary | ICD-10-CM

## 2013-07-12 DIAGNOSIS — Z0000 Encounter for general adult medical examination without abnormal findings: Secondary | ICD-10-CM

## 2013-07-12 DIAGNOSIS — E039 Hypothyroidism, unspecified: Secondary | ICD-10-CM

## 2013-07-12 LAB — CBC WITH DIFFERENTIAL/PLATELET
Eosinophils Absolute: 0.5 10*3/uL (ref 0.0–0.7)
Hemoglobin: 12.3 g/dL (ref 12.0–15.0)
Lymphocytes Relative: 33 % (ref 12–46)
Lymphs Abs: 3.1 10*3/uL (ref 0.7–4.0)
MCH: 28.3 pg (ref 26.0–34.0)
Monocytes Relative: 7 % (ref 3–12)
Neutrophils Relative %: 55 % (ref 43–77)
Platelets: 424 10*3/uL — ABNORMAL HIGH (ref 150–400)
RBC: 4.34 MIL/uL (ref 3.87–5.11)
WBC: 9.4 10*3/uL (ref 4.0–10.5)

## 2013-07-12 LAB — HEPATIC FUNCTION PANEL
ALT: 11 U/L (ref 0–35)
AST: 11 U/L (ref 0–37)
Albumin: 3.9 g/dL (ref 3.5–5.2)
Alkaline Phosphatase: 50 U/L (ref 39–117)
Indirect Bilirubin: 0.2 mg/dL (ref 0.0–0.9)
Total Protein: 6.3 g/dL (ref 6.0–8.3)

## 2013-07-12 LAB — HEMOGLOBIN A1C
Hgb A1c MFr Bld: 5.4 % (ref <5.7)
Mean Plasma Glucose: 108 mg/dL (ref <117)

## 2013-07-12 LAB — BASIC METABOLIC PANEL
CO2: 20 mEq/L (ref 19–32)
Calcium: 9.3 mg/dL (ref 8.4–10.5)
Chloride: 106 mEq/L (ref 96–112)
Glucose, Bld: 86 mg/dL (ref 70–99)
Potassium: 3.8 mEq/L (ref 3.5–5.3)
Sodium: 139 mEq/L (ref 135–145)

## 2013-07-12 LAB — LIPID PANEL
LDL Cholesterol: 142 mg/dL — ABNORMAL HIGH (ref 0–99)
Triglycerides: 125 mg/dL (ref <150)
VLDL: 25 mg/dL (ref 0–40)

## 2013-07-12 NOTE — Telephone Encounter (Signed)
Pt presented to the lab. Future orders released. 

## 2013-07-16 ENCOUNTER — Telehealth: Payer: Self-pay | Admitting: Physician Assistant

## 2013-07-16 ENCOUNTER — Ambulatory Visit: Payer: Federal, State, Local not specified - PPO

## 2013-07-16 DIAGNOSIS — E039 Hypothyroidism, unspecified: Secondary | ICD-10-CM

## 2013-07-16 MED ORDER — LEVOTHYROXINE SODIUM 150 MCG PO TABS: 150.0000 ug | ORAL_TABLET | Freq: Every day | ORAL | Status: AC

## 2013-07-16 NOTE — Telephone Encounter (Signed)
Patient reports she is taking Levothyroxine, gave new dosing instructions [increase to 150 mcg], pt understood & agreed; provider informed/SLS

## 2013-07-16 NOTE — Telephone Encounter (Signed)
PLEASE CALL PATIENT  

## 2013-07-16 NOTE — Telephone Encounter (Signed)
Will increase levothyroxine from to . Please ask patient if she knows what brand she is taking?  It is important to know if she is taking Synthroid or generic levothyroxine.  Will send new RX to pharmacy.  Will need to see patient in 3 months to recheck thyroid levels.

## 2013-07-16 NOTE — Telephone Encounter (Signed)
Rx sent to pharmacy -- patient has been informed by nursing staff.

## 2013-07-16 NOTE — Telephone Encounter (Signed)
Message copied by Marcelline Mates on Tue Jul 16, 2013 11:32 AM ------      Message from: Regis Bill      Created: Mon Jul 15, 2013  2:28 PM      Regarding: Thyroid Medication       Notes Recorded by Regis Bill, CMA on 07/15/2013 at 2:28 PM      Patient informed, understood & agreed; reports that she has been taking thyroid medicine & is low at this time, informed that message would be forwarded to provider upon return to office tomorrow morning and new dosing of medication will be sent to pharmacy and pt will be informed/SLS            Notes Recorded by Regis Bill, CMA on 07/12/2013 at 5:12 PM      [facility] with contact name and number for return call RE: results and further provider instructions/SLS            ----- Message -----         From: Piedad Climes, PA-C         Sent: 07/12/2013   4:47 PM           To: Regis Bill, CMA            Please inform patient that her LDL (lousy) cholesterol was a little high.  I recommend diet low in saturated fats, along with 30 minutes of exercise, at least a couple of times each week.  Will want to recheck this in 6 months.  Also, please ask patient if she is currently taking her thyroid medication. Her TSH is elevated, indicating either she has not been taking medication or that her dose needs to be changed.  Otherwise, labs look good.       ------

## 2013-07-18 ENCOUNTER — Ambulatory Visit: Payer: Federal, State, Local not specified - PPO | Admitting: Physician Assistant

## 2013-07-19 ENCOUNTER — Ambulatory Visit: Payer: Federal, State, Local not specified - PPO

## 2013-07-23 ENCOUNTER — Ambulatory Visit: Payer: Federal, State, Local not specified - PPO | Admitting: Physician Assistant

## 2013-07-29 ENCOUNTER — Ambulatory Visit (INDEPENDENT_AMBULATORY_CARE_PROVIDER_SITE_OTHER): Payer: Federal, State, Local not specified - PPO | Admitting: Physician Assistant

## 2013-07-29 ENCOUNTER — Encounter: Payer: Self-pay | Admitting: Physician Assistant

## 2013-07-29 VITALS — BP 102/68 | HR 76 | Temp 98.1°F | Resp 18 | Ht 67.0 in | Wt 189.5 lb

## 2013-07-29 DIAGNOSIS — R39198 Other difficulties with micturition: Secondary | ICD-10-CM

## 2013-07-29 DIAGNOSIS — E039 Hypothyroidism, unspecified: Secondary | ICD-10-CM

## 2013-07-29 DIAGNOSIS — R3915 Urgency of urination: Secondary | ICD-10-CM

## 2013-07-29 DIAGNOSIS — E282 Polycystic ovarian syndrome: Secondary | ICD-10-CM

## 2013-07-29 DIAGNOSIS — F419 Anxiety disorder, unspecified: Secondary | ICD-10-CM

## 2013-07-29 DIAGNOSIS — F411 Generalized anxiety disorder: Secondary | ICD-10-CM

## 2013-07-29 DIAGNOSIS — R300 Dysuria: Secondary | ICD-10-CM

## 2013-07-29 DIAGNOSIS — J329 Chronic sinusitis, unspecified: Secondary | ICD-10-CM

## 2013-07-29 LAB — URINALYSIS, ROUTINE W REFLEX MICROSCOPIC
Bilirubin Urine: NEGATIVE
Glucose, UA: NEGATIVE mg/dL
Leukocytes, UA: NEGATIVE
Protein, ur: NEGATIVE mg/dL
Specific Gravity, Urine: 1.019 (ref 1.005–1.030)
pH: 5 (ref 5.0–8.0)

## 2013-07-29 MED ORDER — CIPROFLOXACIN HCL 500 MG PO TABS: 500.0000 mg | ORAL_TABLET | Freq: Two times a day (BID) | ORAL | Status: DC

## 2013-07-29 NOTE — Assessment & Plan Note (Addendum)
Stable.  Patient to see Psychiatry in late September.  Patient exhibits eccentric behavior in office but denies hallucinations.  Have made a note about this in previous psychiatry referral.

## 2013-07-29 NOTE — Assessment & Plan Note (Signed)
Patient tolerating medication adjustment.  Requests referral to endocrine.  Referral made.  Follow-up in 1 month for repeat TFT's if she misses Endo appointment.  Patient educated that once she establishes care with endocrinology they will be responsible for medication.

## 2013-07-29 NOTE — Assessment & Plan Note (Signed)
Rx Cipro BID x 3 days.  Will send urine for micro and culture.  If culture negative, patient to follow-up for repeat urine collection to assess for hematuria.  Possible Urology referral.

## 2013-07-29 NOTE — Assessment & Plan Note (Signed)
Patient requesting referral to Endocrine.  Referral made.  For now continue current management.  Patient with OBGYN appointment 07/31/13 for routine gynecological care.

## 2013-07-29 NOTE — Patient Instructions (Signed)
Please take medication as prescribed.  Increase fluid intake.  Follow-up with Psychiatry referral.  I have made a referral for you to endocrinology.  It is very important for you to get your medical records from your last providers to ensure the best quality of care.  Please see OBGYN as scheduled.

## 2013-07-29 NOTE — Progress Notes (Signed)
Patient ID: Kathleen Ware, female   DOB: 1971-04-11, 42 y.o.   MRN: [id number]  Patient presents to clinic today for 1 month follow-up.    Patient with recent abnormal TSH value consistent with hx of hypothyroidism.  Change in dose of synthroid made.  Patient is concerned why her TSH is high if she had been taking medication daily as prescribed.  Discuss possible reasons with patient and the need for more frequent assessment of thyroid function.  Patient states she feels a little better since dose change.  Is requesting referral to endocrinology to manage her hypothyroidism.  Informed patient that I will gladly do this, but I will no longer be managing her thyroid medication or PCOS medication once she has established care with specialist.  Patient c/o burning with urination times 2 weeks.  Endorses urinary urgency and frequency but denies hematuria.  Patient denies fever, chills, flank pain, nausea or vomiting.  Does endorse some lower abdominal pain which she thinks could be due to her PCOS. Last menstrual period finished 7 days ago.  Patient denies anything abnormal with menstruation.  Patient is seeing her OBGYN this Wednesday and states she wishes to defer exam until that appointment.  At last appointment, patient c/o recurrent sinus infections refractory to antibiotic therapy.  Patient was given rx avelox and a referral to ENT.  Patient has seen ENT and is scheduled for CT of sinuses.  She is wanting to know if she can get the CT here today.  I informed patient that since she is being managed by ENT all procedures/labs/etc will have to be performed at the location/time that is set up with ENT.  Patient has scheduled mammogram for 08/09/2013.  Patient was supposed to be seen by psychiatry for anxiety.  Patient states her appointment was rescheduled for October.  Endorses taking medications as prescribed.  At last visit patient seemed to be experiencing some auditory hallucinations.  The same holds  true for today.  Patient denies visual or auditory hallucination, suicidal or homicidal ideations.  Past Medical History  Diagnosis Date  . Thyroid disease   . Asthma   . PCOS (polycystic ovarian syndrome)   . Migraine     Current Outpatient Prescriptions on File Prior to Visit  Medication Sig Dispense Refill  . albuterol (PROVENTIL HFA;VENTOLIN HFA) 108 (90 BASE) MCG/ACT inhaler Inhale 1-2 puffs into the lungs every 6 (six) hours as needed for wheezing.  1 Inhaler  0  . aspirin-acetaminophen-caffeine (EXCEDRIN MIGRAINE) 250-250-65 MG per tablet Take 1 tablet by mouth every 6 (six) hours as needed. For migraine      . butalbital-aspirin-caffeine-codeine (FIORINAL WITH CODEINE) 50-325-40-30 MG capsule Take 1 capsule by mouth every 4 (four) hours as needed. For migraines  30 capsule  0  . levothyroxine (SYNTHROID, LEVOTHROID) 150 MCG tablet Take 1 tablet (150 mcg total) by mouth daily.  90 tablet  3  . metFORMIN (GLUCOPHAGE) 500 MG tablet Take 1 tablet (500 mg total) by mouth 2 (two) times daily.  60 tablet  0  . zolpidem (AMBIEN) 5 MG tablet Take 1 tablet (5 mg total) by mouth at bedtime as needed for sleep.  30 tablet  0   No current facility-administered medications on file prior to visit.    Allergies  Allergen Reactions  . Penicillins Anaphylaxis  . Iodine Other (See Comments)    wheezing  . Sulfa Antibiotics Hives    Family History  Problem Relation Age of Onset  . Cancer Mother   .  Hypertension Mother   . Hyperlipidemia Mother   . Hypertension Father     History   Social History  . Marital Status: Single    Spouse Name: N/A    Number of Children: N/A  . Years of Education: N/A   Social History Main Topics  . Smoking status: Former Games developer  . Smokeless tobacco: None  . Alcohol Use: No  . Drug Use: No  . Sexual Activity: None   Other Topics Concern  . None   Social History Narrative  . None    Review of Systems  Constitutional: Negative for fever,  chills, weight loss and malaise/fatigue.  Respiratory: Negative for cough, shortness of breath and wheezing.   Gastrointestinal: Positive for abdominal pain. Negative for nausea, vomiting, diarrhea, constipation, blood in stool and melena.  Genitourinary: Positive for dysuria, urgency and frequency. Negative for hematuria and flank pain.  Musculoskeletal: Negative for back pain.  Skin: Negative for rash.  Neurological: Negative for dizziness, loss of consciousness and headaches.  Psychiatric/Behavioral: Negative for depression, suicidal ideas, hallucinations and substance abuse. The patient is nervous/anxious.    Filed Vitals:   07/29/13 0937  BP: 102/68  Pulse: 76  Temp: 98.1 F (36.7 C)  Resp: 18    Physical Exam  Vitals reviewed. Constitutional: She is oriented to person, place, and time and well-developed, well-nourished, and in no distress.  HENT:  Head: Normocephalic and atraumatic.  Right Ear: External ear normal.  Left Ear: External ear normal.  Nose: Nose normal.  Mouth/Throat: Oropharynx is clear and moist.  TM WNL bilaterally  Eyes: Conjunctivae are normal. Pupils are equal, round, and reactive to light.  Neck: Normal range of motion. Neck supple. No thyromegaly present.  Cardiovascular: Normal rate, regular rhythm, normal heart sounds and intact distal pulses.   Pulmonary/Chest: Effort normal and breath sounds normal.  Abdominal: Soft. Bowel sounds are normal. She exhibits no distension and no mass. There is no rebound and no guarding.  + suprapubic tenderness with light palpation  Genitourinary:  Patient declined examination.  Lymphadenopathy:    She has no cervical adenopathy.  Neurological: She is alert and oriented to person, place, and time.  Skin: Skin is warm and dry.  Psychiatric: Memory normal. Her mood appears anxious. Her affect is labile. She expresses impulsivity. She does not exhibit a depressed mood. She expresses no homicidal and no suicidal  ideation. She expresses no suicidal plans and no homicidal plans.  Patient demonstrates both anxious and eccentric behavior.  Patient seems to be talking with someone who is not there as soon as I step out of exam room, and seems interrupted when I knock to return to the room.  This is consistent with last examination and interview.     Recent Results (from the past 2160 hour(s))  URINALYSIS W MICROSCOPIC + REFLEX CULTURE     Status: Abnormal   Collection Time    05/18/13  1:36 PM      Result Value Range   Color, Urine YELLOW  YELLOW   APPearance CLOUDY (*) CLEAR   Specific Gravity, Urine 1.021  1.005 - 1.030   pH 5.5  5.0 - 8.0   Glucose, UA NEGATIVE  NEGATIVE mg/dL   Hgb urine dipstick NEGATIVE  NEGATIVE   Bilirubin Urine NEGATIVE  NEGATIVE   Ketones, ur NEGATIVE  NEGATIVE mg/dL   Protein, ur NEGATIVE  NEGATIVE mg/dL   Urobilinogen, UA 0.2  0.0 - 1.0 mg/dL   Nitrite NEGATIVE  NEGATIVE   Leukocytes,  UA NEGATIVE  NEGATIVE   Bacteria, UA MANY (*) RARE   Squamous Epithelial / LPF MANY (*) RARE   Casts HYALINE CASTS (*) NEGATIVE   Comment: GRANULAR CAST  PREGNANCY, URINE     Status: None   Collection Time    05/18/13  1:36 PM      Result Value Range   Preg Test, Ur NEGATIVE  NEGATIVE   Comment:            THE SENSITIVITY OF THIS     METHODOLOGY IS >20 mIU/mL.  URINALYSIS, ROUTINE W REFLEX MICROSCOPIC     Status: Abnormal   Collection Time    06/10/13  9:40 PM      Result Value Range   Color, Urine YELLOW  YELLOW   APPearance CLOUDY (*) CLEAR   Specific Gravity, Urine 1.008  1.005 - 1.030   pH 5.5  5.0 - 8.0   Glucose, UA NEGATIVE  NEGATIVE mg/dL   Hgb urine dipstick TRACE (*) NEGATIVE   Bilirubin Urine NEGATIVE  NEGATIVE   Ketones, ur NEGATIVE  NEGATIVE mg/dL   Protein, ur NEGATIVE  NEGATIVE mg/dL   Urobilinogen, UA 0.2  0.0 - 1.0 mg/dL   Nitrite NEGATIVE  NEGATIVE   Leukocytes, UA NEGATIVE  NEGATIVE  URINE MICROSCOPIC-ADD ON     Status: Abnormal   Collection Time     06/10/13  9:40 PM      Result Value Range   Squamous Epithelial / LPF FEW (*) RARE   RBC / HPF 0-2  <3 RBC/hpf   Bacteria, UA RARE  RARE  PREGNANCY, URINE     Status: None   Collection Time    06/10/13  9:56 PM      Result Value Range   Preg Test, Ur NEGATIVE  NEGATIVE   Comment:            THE SENSITIVITY OF THIS     METHODOLOGY IS >20 mIU/mL.  URINE CULTURE     Status: None   Collection Time    07/03/13  2:17 PM      Result Value Range   Colony Count 45,000 COLONIES/ML     Organism ID, Bacteria Multiple bacterial morphotypes present, none     Organism ID, Bacteria predominant. Suggest appropriate recollection if      Organism ID, Bacteria clinically indicated.    URINALYSIS, ROUTINE W REFLEX MICROSCOPIC     Status: Abnormal   Collection Time    07/03/13  2:17 PM      Result Value Range   Color, Urine YELLOW  YELLOW   APPearance CLEAR  CLEAR   Specific Gravity, Urine 1.022  1.005 - 1.030   pH 5.5  5.0 - 8.0   Glucose, UA NEG  NEG mg/dL   Bilirubin Urine NEG  NEG   Ketones, ur NEG  NEG mg/dL   Hgb urine dipstick TRACE (*) NEG   Protein, ur NEG  NEG mg/dL   Urobilinogen, UA 0.2  0.0 - 1.0 mg/dL   Nitrite NEG  NEG   Leukocytes, UA NEG  NEG  URINALYSIS, MICROSCOPIC ONLY     Status: Abnormal   Collection Time    07/03/13  2:17 PM      Result Value Range   Squamous Epithelial / LPF MANY (*) RARE   Crystals NONE SEEN  NONE SEEN   Casts NONE SEEN  NONE SEEN   WBC, UA 0-2  <3 WBC/hpf   RBC / HPF 0-2  <3 RBC/hpf  Bacteria, UA FEW (*) RARE  POCT URINALYSIS DIPSTICK     Status: None   Collection Time    07/03/13  2:26 PM      Result Value Range   Color, UA Golden     Comment: Dark   Clarity, UA Murky     Comment: Sediment   Glucose, UA neg     Bilirubin, UA neg     Ketones, UA neg     Spec Grav, UA 1.025     Blood, UA Moderate     pH, UA 6.0     Protein, UA neg     Urobilinogen, UA 0.2     Nitrite, UA neg     Leukocytes, UA Negative    CBC WITH DIFFERENTIAL      Status: Abnormal   Collection Time    07/12/13 11:30 AM      Result Value Range   WBC 9.4  4.0 - 10.5 K/uL   RBC 4.34  3.87 - 5.11 MIL/uL   Hemoglobin 12.3  12.0 - 15.0 g/dL   HCT 16.1  09.6 - 04.5 %   MCV 84.3  78.0 - 100.0 fL   MCH 28.3  26.0 - 34.0 pg   MCHC 33.6  30.0 - 36.0 g/dL   RDW 40.9  81.1 - 91.4 %   Platelets 424 (*) 150 - 400 K/uL   Neutrophils Relative % 55  43 - 77 %   Neutro Abs 5.2  1.7 - 7.7 K/uL   Lymphocytes Relative 33  12 - 46 %   Lymphs Abs 3.1  0.7 - 4.0 K/uL   Monocytes Relative 7  3 - 12 %   Monocytes Absolute 0.6  0.1 - 1.0 K/uL   Eosinophils Relative 5  0 - 5 %   Eosinophils Absolute 0.5  0.0 - 0.7 K/uL   Basophils Relative 0  0 - 1 %   Basophils Absolute 0.0  0.0 - 0.1 K/uL   Smear Review Criteria for review not met    BASIC METABOLIC PANEL     Status: None   Collection Time    07/12/13 11:30 AM      Result Value Range   Sodium 139  135 - 145 mEq/L   Potassium 3.8  3.5 - 5.3 mEq/L   Chloride 106  96 - 112 mEq/L   CO2 20  19 - 32 mEq/L   Glucose, Bld 86  70 - 99 mg/dL   BUN 14  6 - 23 mg/dL   Creat 7.82  9.56 - 2.13 mg/dL   Calcium 9.3  8.4 - 08.6 mg/dL  HEPATIC FUNCTION PANEL     Status: None   Collection Time    07/12/13 11:30 AM      Result Value Range   Total Bilirubin 0.3  0.3 - 1.2 mg/dL   Bilirubin, Direct 0.1  0.0 - 0.3 mg/dL   Indirect Bilirubin 0.2  0.0 - 0.9 mg/dL   Alkaline Phosphatase 50  39 - 117 U/L   AST 11  0 - 37 U/L   ALT 11  0 - 35 U/L   Total Protein 6.3  6.0 - 8.3 g/dL   Albumin 3.9  3.5 - 5.2 g/dL  TSH     Status: Abnormal   Collection Time    07/12/13 11:30 AM      Result Value Range   TSH 5.350 (*) 0.350 - 4.500 uIU/mL  LIPID PANEL     Status: Abnormal  Collection Time    07/12/13 11:30 AM      Result Value Range   Cholesterol 228 (*) 0 - 200 mg/dL   Comment: ATP III Classification:           < 200        mg/dL        Desirable          200 - 239     mg/dL        Borderline High          >= 240         mg/dL        High         Triglycerides 125  <150 mg/dL   HDL 61  >16 mg/dL   Total CHOL/HDL Ratio 3.7     VLDL 25  0 - 40 mg/dL   LDL Cholesterol 109 (*) 0 - 99 mg/dL   Comment:       Total Cholesterol/HDL Ratio:CHD Risk                            Coronary Heart Disease Risk Table                                            Men       Women              1/2 Average Risk              3.4        3.3                  Average Risk              5.0        4.4               2X Average Risk              9.6        7.1               3X Average Risk             23.4       11.0     Use the calculated Patient Ratio above and the CHD Risk table      to determine the patient's CHD Risk.     ATP III Classification (LDL):           < 100        mg/dL         Optimal          100 - 129     mg/dL         Near or Above Optimal          130 - 159     mg/dL         Borderline High          160 - 189     mg/dL         High           > 190        mg/dL         Very High        HEMOGLOBIN A1C     Status:  None   Collection Time    07/12/13 11:30 AM      Result Value Range   Hemoglobin A1C 5.4  <5.7 %   Comment:                                                                            According to the ADA Clinical Practice Recommendations for 2011, when     HbA1c is used as a screening test:             >=6.5%   Diagnostic of Diabetes Mellitus                (if abnormal result is confirmed)           5.7-6.4%   Increased risk of developing Diabetes Mellitus           References:Diagnosis and Classification of Diabetes Mellitus,Diabetes     Care,2011,34(Suppl 1):S62-S69 and Standards of Medical Care in             Diabetes - 2011,Diabetes Care,2011,34 (Suppl 1):S11-S61.         Mean Plasma Glucose 108  <117 mg/dL   Diagnostics: Urine dipstick -- moderate blood.  Negative for nitrites and leukocyte esterase.  Will send for micro and culture  Assessment/Plan: PCOS (polycystic ovarian  syndrome) Patient requesting referral to Endocrine.  Referral made.  For now continue current management.  Patient with OBGYN appointment 07/31/13 for routine gynecological care.  Sinusitis Asymptomatic at present.  Follow-up with ENT.  Hypothyroidism Patient tolerating medication adjustment.  Requests referral to endocrine.  Referral made.  Follow-up in 1 month for repeat TFT's if she misses Endo appointment.  Patient educated that once she establishes care with endocrinology they will be responsible for medication.  Urinary urgency Rx Cipro BID x 3 days.  Will send urine for micro and culture.  If culture negative, patient to follow-up for repeat urine collection to assess for hematuria.  Possible Urology referral.  Anxiety Stable.  Patient to see Psychiatry in late September.  Patient exhibits eccentric behavior in office but denies hallucinations.  Have made a note about this in previous psychiatry referral.

## 2013-07-29 NOTE — Assessment & Plan Note (Signed)
Asymptomatic at present.  Follow-up with ENT.

## 2013-07-31 ENCOUNTER — Other Ambulatory Visit: Payer: Self-pay | Admitting: Otolaryngology

## 2013-07-31 DIAGNOSIS — R0981 Nasal congestion: Secondary | ICD-10-CM

## 2013-08-02 ENCOUNTER — Ambulatory Visit
Admission: RE | Admit: 2013-08-02 | Discharge: 2013-08-02 | Disposition: A | Discharge status: Home / Self Care | Payer: Federal, State, Local not specified - PPO | Source: Ambulatory Visit | Attending: Otolaryngology | Admitting: Otolaryngology

## 2013-08-02 DIAGNOSIS — R0981 Nasal congestion: Secondary | ICD-10-CM

## 2013-08-08 ENCOUNTER — Ambulatory Visit: Admission: RE | Admit: 2013-08-08 | Payer: Federal, State, Local not specified - PPO | Source: Ambulatory Visit

## 2013-08-08 ENCOUNTER — Other Ambulatory Visit: Payer: Self-pay | Admitting: Physician Assistant

## 2013-08-08 ENCOUNTER — Ambulatory Visit
Admission: RE | Admit: 2013-08-08 | Discharge: 2013-08-08 | Disposition: A | Discharge status: Home / Self Care | Payer: Federal, State, Local not specified - PPO | Source: Ambulatory Visit

## 2013-08-08 DIAGNOSIS — Z1231 Encounter for screening mammogram for malignant neoplasm of breast: Secondary | ICD-10-CM

## 2013-08-14 ENCOUNTER — Ambulatory Visit (HOSPITAL_COMMUNITY): Payer: Federal, State, Local not specified - PPO | Admitting: Psychiatry

## 2013-08-16 ENCOUNTER — Ambulatory Visit (HOSPITAL_COMMUNITY): Payer: Federal, State, Local not specified - PPO | Admitting: Psychiatry

## 2013-08-19 ENCOUNTER — Other Ambulatory Visit: Payer: Self-pay | Admitting: Physician Assistant

## 2013-08-19 ENCOUNTER — Telehealth: Payer: Self-pay | Admitting: Physician Assistant

## 2013-08-19 DIAGNOSIS — R928 Other abnormal and inconclusive findings on diagnostic imaging of breast: Secondary | ICD-10-CM

## 2013-08-19 NOTE — Telephone Encounter (Signed)
Discussed results with patient.  Instructed patient she should hear from the breast center to schedule further imaging.  She is to call our office if she has not heard from the breast center within a couple of days.

## 2013-08-23 ENCOUNTER — Other Ambulatory Visit: Payer: Self-pay

## 2013-08-23 ENCOUNTER — Other Ambulatory Visit: Payer: Self-pay | Admitting: Physician Assistant

## 2013-08-23 DIAGNOSIS — R928 Other abnormal and inconclusive findings on diagnostic imaging of breast: Secondary | ICD-10-CM

## 2013-08-30 ENCOUNTER — Other Ambulatory Visit: Payer: Federal, State, Local not specified - PPO

## 2013-09-13 ENCOUNTER — Ambulatory Visit
Admission: RE | Admit: 2013-09-13 | Discharge: 2013-09-13 | Disposition: A | Discharge status: Home / Self Care | Payer: Federal, State, Local not specified - PPO | Source: Ambulatory Visit | Attending: Physician Assistant | Admitting: Physician Assistant

## 2013-09-13 ENCOUNTER — Other Ambulatory Visit: Payer: Self-pay | Admitting: Physician Assistant

## 2013-09-13 DIAGNOSIS — N6310 Unspecified lump in the right breast, unspecified quadrant: Secondary | ICD-10-CM

## 2013-09-13 DIAGNOSIS — R928 Other abnormal and inconclusive findings on diagnostic imaging of breast: Secondary | ICD-10-CM

## 2013-09-19 ENCOUNTER — Other Ambulatory Visit: Payer: Federal, State, Local not specified - PPO

## 2013-09-27 ENCOUNTER — Ambulatory Visit (INDEPENDENT_AMBULATORY_CARE_PROVIDER_SITE_OTHER): Payer: Federal, State, Local not specified - PPO | Admitting: Physician Assistant

## 2013-09-27 ENCOUNTER — Encounter: Payer: Self-pay | Admitting: Physician Assistant

## 2013-09-27 VITALS — BP 100/68 | HR 75 | Temp 98.3°F | Resp 20 | Ht 67.0 in | Wt 193.8 lb

## 2013-09-27 DIAGNOSIS — G43909 Migraine, unspecified, not intractable, without status migrainosus: Secondary | ICD-10-CM

## 2013-09-27 DIAGNOSIS — J329 Chronic sinusitis, unspecified: Secondary | ICD-10-CM

## 2013-09-27 DIAGNOSIS — G4700 Insomnia, unspecified: Secondary | ICD-10-CM

## 2013-09-27 MED ORDER — ZOLPIDEM TARTRATE 5 MG PO TABS: 5.0000 mg | ORAL_TABLET | Freq: Every evening | ORAL | Status: DC | PRN

## 2013-09-27 MED ORDER — LEVOFLOXACIN 500 MG PO TABS: 500.0000 mg | ORAL_TABLET | Freq: Every day | ORAL | Status: DC

## 2013-09-27 MED ORDER — ERGOTAMINE-CAFFEINE 1-100 MG PO TABS: ORAL_TABLET | ORAL | Status: DC

## 2013-09-27 MED ORDER — BUTALBITAL-ASA-CAFF-CODEINE 50-325-40-30 MG PO CAPS: 1.0000 | ORAL_CAPSULE | ORAL | Status: DC | PRN

## 2013-09-27 NOTE — Assessment & Plan Note (Signed)
Rx Levaquin.  Increase fluids.  Saline nasal spray.  Claritin.  Probiotic.  Humidifier.  Follow-up with ENT if symptoms do not improve.

## 2013-09-27 NOTE — Patient Instructions (Addendum)
Please take antibiotic as prescribed.  Increase fluid intake.  Continue daily claritin.  Saline nasal spray.  Humidifier in bedroom.  Please follow up with GI and the [facility].    Cafergot for migraine around procedure time.  Follow-up as needed.  Please follow-up with Psychiatry to help with your anxiety.  This is very important.  I hope you feel better!!

## 2013-09-27 NOTE — Progress Notes (Signed)
Patient ID: Kathleen Ware, female   DOB: April 22, 1971, 42 y.o.   MRN: [id number]  Patient presents to clinic today c/o sinus pressure, sinus pain, ear pain, nasal congestion, postnasal drip x 1.5 weeks.  Denies fever, chills, recent travel.  Endorses slight generalized aches.  Patient with history or recurrent sinusitis.  Is followed by ENT.  Patient denies recent sick contact.     Patient also requesting medication for migraines.  Patient has upcoming procedure and was told not to take anything with aspirin in it.  Her current migraine medication -- Fiorinal -- contains aspirin.  Wants to know if she can have a couple of tablets of something else to take in case of migraine around time of her procedure.   Past Medical History  Diagnosis Date  . Thyroid disease   . Asthma   . PCOS (polycystic ovarian syndrome)   . Migraine     Current Outpatient Prescriptions on File Prior to Visit  Medication Sig Dispense Refill  . albuterol (PROVENTIL HFA;VENTOLIN HFA) 108 (90 BASE) MCG/ACT inhaler Inhale 1-2 puffs into the lungs every 6 (six) hours as needed for wheezing.  1 Inhaler  0  . aspirin-acetaminophen-caffeine (EXCEDRIN MIGRAINE) 250-250-65 MG per tablet Take 1 tablet by mouth every 6 (six) hours as needed. For migraine      . levothyroxine (SYNTHROID, LEVOTHROID) 150 MCG tablet Take 1 tablet (150 mcg total) by mouth daily.  90 tablet  3  . metFORMIN (GLUCOPHAGE) 500 MG tablet Take 1 tablet (500 mg total) by mouth 2 (two) times daily.  60 tablet  0   No current facility-administered medications on file prior to visit.    Allergies  Allergen Reactions  . Penicillins Anaphylaxis  . Iodine Other (See Comments)    wheezing  . Sulfa Antibiotics Hives    Family History  Problem Relation Age of Onset  . Cancer Mother   . Hypertension Mother   . Hyperlipidemia Mother   . Hypertension Father     History   Social History  . Marital Status: Single    Spouse Name: N/A    Number of  Children: N/A  . Years of Education: N/A   Social History Main Topics  . Smoking status: Former Games developer  . Smokeless tobacco: None  . Alcohol Use: No  . Drug Use: No  . Sexual Activity: None   Other Topics Concern  . None   Social History Narrative  . None   ROS See HPI.  All other ROS are negative.  Filed Vitals:   09/27/13 0848  BP: 100/68  Pulse: 75  Temp: 98.3 F (36.8 C)  Resp: 20    Physical Exam  Vitals reviewed. Constitutional: She is oriented to person, place, and time and well-developed, well-nourished, and in no distress.  HENT:  Head: Normocephalic and atraumatic.  Right Ear: External ear normal.  Left Ear: External ear normal.  Nose: Nose normal.  TM within normal limits bilaterally.  Tenderness to percussion over right frontal sinus and bilateral maxillary sinuses.  Clear post-nasal drip noted on exam.  Eyes: Conjunctivae are normal.  Neck: Neck supple.  Cardiovascular: Normal rate, regular rhythm, normal heart sounds and intact distal pulses.   Pulmonary/Chest: Effort normal and breath sounds normal. No respiratory distress. She has no wheezes. She has no rales. She exhibits no tenderness.  Lymphadenopathy:    She has no cervical adenopathy.  Neurological: She is alert and oriented to person, place, and time.  Skin: Skin is warm and  dry. No rash noted.  Psychiatric:  anxious     Recent Results (from the past 2160 hour(s))  URINE CULTURE     Status: None   Collection Time    07/03/13  2:17 PM      Result Value Range   Colony Count 45,000 COLONIES/ML     Organism ID, Bacteria Multiple bacterial morphotypes present, none     Organism ID, Bacteria predominant. Suggest appropriate recollection if      Organism ID, Bacteria clinically indicated.    URINALYSIS, ROUTINE W REFLEX MICROSCOPIC     Status: Abnormal   Collection Time    07/03/13  2:17 PM      Result Value Range   Color, Urine YELLOW  YELLOW   APPearance CLEAR  CLEAR   Specific  Gravity, Urine 1.022  1.005 - 1.030   pH 5.5  5.0 - 8.0   Glucose, UA NEG  NEG mg/dL   Bilirubin Urine NEG  NEG   Ketones, ur NEG  NEG mg/dL   Hgb urine dipstick TRACE (*) NEG   Protein, ur NEG  NEG mg/dL   Urobilinogen, UA 0.2  0.0 - 1.0 mg/dL   Nitrite NEG  NEG   Leukocytes, UA NEG  NEG  URINALYSIS, MICROSCOPIC ONLY     Status: Abnormal   Collection Time    07/03/13  2:17 PM      Result Value Range   Squamous Epithelial / LPF MANY (*) RARE   Crystals NONE SEEN  NONE SEEN   Casts NONE SEEN  NONE SEEN   WBC, UA 0-2  <3 WBC/hpf   RBC / HPF 0-2  <3 RBC/hpf   Bacteria, UA FEW (*) RARE  POCT URINALYSIS DIPSTICK     Status: None   Collection Time    07/03/13  2:26 PM      Result Value Range   Color, UA Golden     Comment: Dark   Clarity, UA Murky     Comment: Sediment   Glucose, UA neg     Bilirubin, UA neg     Ketones, UA neg     Spec Grav, UA 1.025     Blood, UA Moderate     pH, UA 6.0     Protein, UA neg     Urobilinogen, UA 0.2     Nitrite, UA neg     Leukocytes, UA Negative    CBC WITH DIFFERENTIAL     Status: Abnormal   Collection Time    07/12/13 11:30 AM      Result Value Range   WBC 9.4  4.0 - 10.5 K/uL   RBC 4.34  3.87 - 5.11 MIL/uL   Hemoglobin 12.3  12.0 - 15.0 g/dL   HCT 60.4  54.0 - 98.1 %   MCV 84.3  78.0 - 100.0 fL   MCH 28.3  26.0 - 34.0 pg   MCHC 33.6  30.0 - 36.0 g/dL   RDW 19.1  47.8 - 29.5 %   Platelets 424 (*) 150 - 400 K/uL   Neutrophils Relative % 55  43 - 77 %   Neutro Abs 5.2  1.7 - 7.7 K/uL   Lymphocytes Relative 33  12 - 46 %   Lymphs Abs 3.1  0.7 - 4.0 K/uL   Monocytes Relative 7  3 - 12 %   Monocytes Absolute 0.6  0.1 - 1.0 K/uL   Eosinophils Relative 5  0 - 5 %   Eosinophils Absolute 0.5  0.0 - 0.7 K/uL   Basophils Relative 0  0 - 1 %   Basophils Absolute 0.0  0.0 - 0.1 K/uL   Smear Review Criteria for review not met    BASIC METABOLIC PANEL     Status: None   Collection Time    07/12/13 11:30 AM      Result Value Range    Sodium 139  135 - 145 mEq/L   Potassium 3.8  3.5 - 5.3 mEq/L   Chloride 106  96 - 112 mEq/L   CO2 20  19 - 32 mEq/L   Glucose, Bld 86  70 - 99 mg/dL   BUN 14  6 - 23 mg/dL   Creat 5.78  4.69 - 6.29 mg/dL   Calcium 9.3  8.4 - 52.8 mg/dL  HEPATIC FUNCTION PANEL     Status: None   Collection Time    07/12/13 11:30 AM      Result Value Range   Total Bilirubin 0.3  0.3 - 1.2 mg/dL   Bilirubin, Direct 0.1  0.0 - 0.3 mg/dL   Indirect Bilirubin 0.2  0.0 - 0.9 mg/dL   Alkaline Phosphatase 50  39 - 117 U/L   AST 11  0 - 37 U/L   ALT 11  0 - 35 U/L   Total Protein 6.3  6.0 - 8.3 g/dL   Albumin 3.9  3.5 - 5.2 g/dL  TSH     Status: Abnormal   Collection Time    07/12/13 11:30 AM      Result Value Range   TSH 5.350 (*) 0.350 - 4.500 uIU/mL  LIPID PANEL     Status: Abnormal   Collection Time    07/12/13 11:30 AM      Result Value Range   Cholesterol 228 (*) 0 - 200 mg/dL   Comment: ATP III Classification:           < 200        mg/dL        Desirable          200 - 239     mg/dL        Borderline High          >= 240        mg/dL        High         Triglycerides 125  <150 mg/dL   HDL 61  >41 mg/dL   Total CHOL/HDL Ratio 3.7     VLDL 25  0 - 40 mg/dL   LDL Cholesterol 324 (*) 0 - 99 mg/dL   Comment:       Total Cholesterol/HDL Ratio:CHD Risk                            Coronary Heart Disease Risk Table                                            Men       Women              1/2 Average Risk              3.4        3.3                  Average Risk  5.0        4.4               2X Average Risk              9.6        7.1               3X Average Risk             23.4       11.0     Use the calculated Patient Ratio above and the CHD Risk table      to determine the patient's CHD Risk.     ATP III Classification (LDL):           < 100        mg/dL         Optimal          100 - 129     mg/dL         Near or Above Optimal          130 - 159     mg/dL         Borderline  High          160 - 189     mg/dL         High           > 190        mg/dL         Very High        HEMOGLOBIN A1C     Status: None   Collection Time    07/12/13 11:30 AM      Result Value Range   Hemoglobin A1C 5.4  <5.7 %   Comment:                                                                            According to the ADA Clinical Practice Recommendations for 2011, when     HbA1c is used as a screening test:             >=6.5%   Diagnostic of Diabetes Mellitus                (if abnormal result is confirmed)           5.7-6.4%   Increased risk of developing Diabetes Mellitus           References:Diagnosis and Classification of Diabetes Mellitus,Diabetes     Care,2011,34(Suppl 1):S62-S69 and Standards of Medical Care in             Diabetes - 2011,Diabetes Care,2011,34 (Suppl 1):S11-S61.         Mean Plasma Glucose 108  <117 mg/dL  URINE CULTURE     Status: None   Collection Time    07/29/13 10:12 AM      Result Value Range   Colony Count 30,000 COLONIES/ML     Organism ID, Bacteria Multiple bacterial morphotypes present, none     Organism ID, Bacteria predominant. Suggest appropriate recollection if      Organism ID, Bacteria clinically indicated.    URINALYSIS, ROUTINE W REFLEX MICROSCOPIC     Status:  None   Collection Time    07/29/13 10:12 AM      Result Value Range   Color, Urine YELLOW  YELLOW   APPearance CLEAR  CLEAR   Specific Gravity, Urine 1.019  1.005 - 1.030   pH 5.0  5.0 - 8.0   Glucose, UA NEG  NEG mg/dL   Bilirubin Urine NEG  NEG   Ketones, ur NEG  NEG mg/dL   Hgb urine dipstick NEG  NEG   Protein, ur NEG  NEG mg/dL   Urobilinogen, UA 0.2  0.0 - 1.0 mg/dL   Nitrite NEG  NEG   Leukocytes, UA NEG  NEG    Assessment/Plan: Sinusitis Rx Levaquin.  Increase fluids.  Saline nasal spray.  Claritin.  Probiotic.  Humidifier.  Follow-up with ENT if symptoms do not improve.  Migraine Will give Rx for a few tablets of Cafergot.  No refills.  Patient  will only prescribed one medication for migraine abortion from this point on.

## 2013-09-27 NOTE — Assessment & Plan Note (Signed)
Will give Rx for a few tablets of Cafergot.  No refills.  Patient will only prescribed one medication for migraine abortion from this point on.

## 2013-10-01 ENCOUNTER — Telehealth: Payer: Self-pay

## 2013-10-01 DIAGNOSIS — G43909 Migraine, unspecified, not intractable, without status migrainosus: Secondary | ICD-10-CM

## 2013-10-01 MED ORDER — SUMATRIPTAN SUCCINATE 50 MG PO TABS: 50.0000 mg | ORAL_TABLET | Freq: Once | ORAL | Status: DC

## 2013-10-01 NOTE — Telephone Encounter (Signed)
Pharmacy called sating that the medication Cafergot 1-100 mg was not available at pharmacy. Pharmacy sates that this medication cannot be supplied because they do not carry the medication. Pharmacy also said other pharmacy had been contacted and they do not carry the medication either. Patient then called stating that she was in a tremendous amount of pain due to migraine. Wanted to know if something else could be prescribed and soon.  Please advise,   Thanks!

## 2013-10-01 NOTE — Telephone Encounter (Signed)
Notified pt and she voices understanding. 

## 2013-10-01 NOTE — Telephone Encounter (Signed)
Rx for Imitrex for migraine.  Take 1 pill, may repeat once in 2 hours.  If symptoms are severe, and are not going away with medication, she should come into the office or go to the ED.

## 2013-10-01 NOTE — Telephone Encounter (Signed)
Patient called back regarding this.

## 2013-10-02 ENCOUNTER — Ambulatory Visit
Admission: RE | Admit: 2013-10-02 | Discharge: 2013-10-02 | Disposition: A | Discharge status: Home / Self Care | Payer: Federal, State, Local not specified - PPO | Source: Ambulatory Visit | Attending: Physician Assistant | Admitting: Physician Assistant

## 2013-10-02 ENCOUNTER — Other Ambulatory Visit: Payer: Self-pay | Admitting: Physician Assistant

## 2013-10-02 DIAGNOSIS — N6310 Unspecified lump in the right breast, unspecified quadrant: Secondary | ICD-10-CM

## 2013-10-09 ENCOUNTER — Ambulatory Visit (INDEPENDENT_AMBULATORY_CARE_PROVIDER_SITE_OTHER): Payer: Federal, State, Local not specified - PPO | Admitting: General Surgery

## 2013-10-09 ENCOUNTER — Encounter (INDEPENDENT_AMBULATORY_CARE_PROVIDER_SITE_OTHER): Payer: Self-pay | Admitting: General Surgery

## 2013-10-09 VITALS — BP 110/68 | HR 60 | Resp 16 | Ht 67.0 in | Wt 193.6 lb

## 2013-10-09 DIAGNOSIS — N63 Unspecified lump in unspecified breast: Secondary | ICD-10-CM

## 2013-10-09 DIAGNOSIS — N6310 Unspecified lump in the right breast, unspecified quadrant: Secondary | ICD-10-CM

## 2013-10-09 NOTE — Patient Instructions (Signed)
Breast Biopsy  A breast biopsy is a procedure where a sample of breast tissue is removed from your breast. The tissue is examined under a microscope to see if cancerous cells are present. A breast biopsy is done when there is:  · Any undiagnosed breast mass (tumor).  · Nipple abnormalities, dimpling, crusting, or ulcerations.  · Abnormal discharge from the nipple, especially blood.  · Redness, swelling, and pain of the breast.  · Calcium deposits (calcifications) or abnormalities seen on a mammogram, ultrasound result, or results of magnetic resonance imaging (MRI).  · Suspicious changes in the breast seen on your mammogram.  If the tumor is found to be cancerous (malignant), a breast biopsy can help to determine what the best treatment is for you. There are many different types of breast biopsies. Talk to your caregiver about your options and which type is best for you.  LET YOUR CAREGIVER KNOW ABOUT:  · Allergies to food or medicine.  · Medicines taken, including vitamins, herbs, eyedrops, over-the-counter medicines, and creams.  · Use of steroids (by mouth or creams).  · Previous problems with anesthetics or numbing medicines.  · History of bleeding problems or blood clots.  · Previous surgery.  · Other health problems, including diabetes and kidney problems.  · Any recent colds or infections.  · Possibility of pregnancy, if this applies.  RISKS AND COMPLICATIONS   · Bleeding.  · Infection.  · Allergy to medicines.  · Bruising and swelling of the breast.  · Alteration in the shape of the breast.  · Not finding the lump or abnormality.  · Needing more surgery.  BEFORE THE PROCEDURE  · Arrange for someone to drive you home after the procedure.  · Do not smoke for 2 weeks before the procedure. Stop smoking, if you smoke.  · Do not drink alcohol for 24 hours before procedure.  · Wear a good support bra to the procedure.  PROCEDURE   You may be given a medicine to numb the breast area (local anesthesia) or a medicine  to make you sleep (general anesthesia) during the procedure. The following are the different types of biopsies that can be performed.   · Fine-needle aspiration A thin needle is attached to a syringe and inserted into the breast lump. Fluid and cells are removed and then looked at under a microscope. If the breast lump cannot be felt, an ultrasound may be used to help locate the lump and place the needle in the correct area.    · Core needle biopsy A wide, hollow needle (core needle) is inserted into the breast lump 3 6 times to get tissue samples or cores. The samples are removed. The needle is usually placed in the correct area by using an ultrasound or X-ray.    · Stereotactic biopsy X-ray equipment and a computer are used to analyze X-ray pictures of the breast lump. The computer then finds exactly where the core needle needs to be inserted. Tissue samples are removed.    · Vacuum-assisted biopsy A small incision (less than ¼ inch) is made in your breast. A biopsy device that includes a hollow needle and vacuum is passed through the incision and into the breast tissue. The vacuum gently draws abnormal breast tissue into the needle to remove it. This type of biopsy removes a larger tissue sample than a regular core needle biopsy. No stitches are needed, and there is usually little scarring.  · Ultrasound-guided core needle biopsy A high frequency ultrasound helps guide   the core needle to the area of the mass or abnormality. An incision is made to insert the needle. Tissue samples are removed.  · Open biopsy A larger incision is made in the breast. Your caregiver will attempt to remove the whole breast lump or as much as possible.  AFTER THE PROCEDURE  · You will be taken to the recovery area. If you are doing well and have no problems, you will be allowed to go home.  · You may notice bruising on your breast. This is normal.  · Your caregiver may apply a pressure dressing on your breast for 24 48 hours. A  pressure dressing is a bandage that is wrapped tightly around the chest to stop fluid from collecting underneath tissues.  Document Released: 11/07/2005 Document Revised: 03/04/2013 Document Reviewed: 12/08/2011  [employer]® Patient Information ©2014 [employer].

## 2013-10-09 NOTE — Progress Notes (Signed)
Subjective:   right breast mass, sclerosing lesion  Patient ID: Kathleen Ware, female   DOB: 03/22/1971, 42 y.o.   MRN: [id number]  HPI Patient is a 42 year old female referred by Dr.Jarosz for a recent diagnosis of sclerosing lesion of the right breast. The patient is the daughter of a breast cancer patient of mine. She has no personal history of breast cancer. She does have a history of reduction mammoplasty several years ago. She has a history of benign cysts which have been seen on imaging but no previous biopsies. She recently presented for a screening mammogram. Last had been about 5 years previously.  A mass in the medial right breast was noted and diagnostic mammogram and ultrasound recommended. This confirmed a mass. Ultrasound revealed a 5 mm hypoechoic somewhat suspicious mass in the medial aspect of the right breast 3:00 position 3 cm from the nipple. Ultrasound also noted what appeared to be a small cluster of microcysts at the 4:00 position 5 cm from the nipple. Large core needle biopsy of the mass was recommended and accepted. This was performed and has revealed a complex sclerosing lesion. The patient was referred for complete excision. She has some occasional breast soreness but no masses or nipple discharge.  Past Medical History  Diagnosis Date  . Thyroid disease   . Asthma   . PCOS (polycystic ovarian syndrome)   . Migraine   . Anemia   . Arthritis    Past Surgical History  Procedure Laterality Date  . Gastric bypass    . Thyroidectomy, partial    . Abdominal surgery    . Colonoscopy     Current Outpatient Prescriptions  Medication Sig Dispense Refill  . butalbital-aspirin-caffeine-codeine (FIORINAL WITH CODEINE) 50-325-40-30 MG capsule Take 1 capsule by mouth every 4 (four) hours as needed. For migraines  30 capsule  0  . levothyroxine (SYNTHROID, LEVOTHROID) 150 MCG tablet Take 1 tablet (150 mcg total) by mouth daily.  90 tablet  3  . metFORMIN (GLUCOPHAGE) 500 MG  tablet Take 1 tablet (500 mg total) by mouth 2 (two) times daily.  60 tablet  0  . zolpidem (AMBIEN) 5 MG tablet Take 1 tablet (5 mg total) by mouth at bedtime as needed for sleep.  30 tablet  0  . albuterol (PROVENTIL HFA;VENTOLIN HFA) 108 (90 BASE) MCG/ACT inhaler Inhale 1-2 puffs into the lungs every 6 (six) hours as needed for wheezing.  1 Inhaler  0  . aspirin-acetaminophen-caffeine (EXCEDRIN MIGRAINE) 250-250-65 MG per tablet Take 1 tablet by mouth every 6 (six) hours as needed. For migraine      . SUMAtriptan (IMITREX) 50 MG tablet Take 1 tablet (50 mg total) by mouth once. May repeat in 2 hours if headache persists or recurs.  10 tablet  0   No current facility-administered medications for this visit.   Allergies  Allergen Reactions  . Penicillins Anaphylaxis  . Iodine Other (See Comments)    wheezing  . Sulfa Antibiotics Hives   History  Substance Use Topics  . Smoking status: Former Games developer  . Smokeless tobacco: Not on file  . Alcohol Use: No     Review of Systems  Constitutional: Negative.   Respiratory: Negative.   Cardiovascular: Negative.        Objective:   Physical Exam BP 110/68  Pulse 60  Resp 16  Ht 5\' 7"  (1.702 m)  Wt 193 lb 9.6 oz (87.816 kg)  BMI 30.31 kg/m2  LMP 09/13/2013 General: Alert, Moderately obese Caucasian female.,  in no distress Skin: Warm and dry without rash or infection. HEENT: No palpable masses or thyromegaly. Sclera nonicteric. Pupils equal round and reactive. Oropharynx clear. Breasts: Healed mammoplasty incisions. No palpable masses or skin changes. Lymph nodes: No cervical, supraclavicular, or inguinal nodes palpable. Lungs: Breath sounds clear and equal without increased work of breathing Cardiovascular: Regular rate and rhythm without murmur. No JVD or edema. Peripheral pulses intact. Neurologic: Alert and fully oriented. Gait normal.    Assessment:     Recent core biopsy of 5 mm mass medial right breast showing complex  sclerosing lesion. I reviewed the findings with the patient in detail. I agree with complete excision to rule out the small chance of underlying malignancy particularly with her family history of breast cancer in her mother. She is concerned about the area microcysts and 6 month followup was recommended for this. I told her I did not feel that surgical excision was warranted for this and she understands and agrees. Discuss the nature of the surgery and expected recovery and indications as well as risks of anesthetic complications, bleeding and infection. All her questions were answered.    Plan:     Needle localized right breast lumpectomy under general anesthesia as an outpatient.

## 2013-10-10 ENCOUNTER — Encounter (HOSPITAL_BASED_OUTPATIENT_CLINIC_OR_DEPARTMENT_OTHER): Payer: Self-pay | Admitting: *Deleted

## 2013-10-10 NOTE — Progress Notes (Signed)
Pt takes metformin for cystic disease-no cardiac or diabetes-hx asthma controlled. She is anxious.

## 2013-10-16 ENCOUNTER — Encounter (HOSPITAL_BASED_OUTPATIENT_CLINIC_OR_DEPARTMENT_OTHER): Payer: Self-pay | Admitting: Certified Registered Nurse Anesthetist

## 2013-10-16 ENCOUNTER — Other Ambulatory Visit (INDEPENDENT_AMBULATORY_CARE_PROVIDER_SITE_OTHER): Payer: Self-pay | Admitting: General Surgery

## 2013-10-16 ENCOUNTER — Ambulatory Visit
Admission: RE | Admit: 2013-10-16 | Discharge: 2013-10-16 | Disposition: A | Discharge status: Home / Self Care | Payer: Federal, State, Local not specified - PPO | Source: Ambulatory Visit | Attending: General Surgery | Admitting: General Surgery

## 2013-10-16 ENCOUNTER — Ambulatory Visit (HOSPITAL_BASED_OUTPATIENT_CLINIC_OR_DEPARTMENT_OTHER)
Admission: RE | Admit: 2013-10-16 | Payer: Federal, State, Local not specified - PPO | Source: Ambulatory Visit | Admitting: General Surgery

## 2013-10-16 DIAGNOSIS — N6310 Unspecified lump in the right breast, unspecified quadrant: Secondary | ICD-10-CM

## 2013-10-16 HISTORY — DX: Presence of spectacles and contact lenses: Z97.3

## 2013-10-16 SURGERY — BREAST LUMPECTOMY WITH NEEDLE LOCALIZATION
Anesthesia: General | Laterality: Right

## 2013-10-21 ENCOUNTER — Telehealth (INDEPENDENT_AMBULATORY_CARE_PROVIDER_SITE_OTHER): Payer: Self-pay | Admitting: General Surgery

## 2013-10-21 NOTE — Telephone Encounter (Signed)
Pt wants to re schedule surgery

## 2013-10-21 NOTE — Telephone Encounter (Signed)
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 We will plan to reschedule and proceed with needle localized excision of the sclerosing lesion as originally planned.

## 2013-10-25 ENCOUNTER — Other Ambulatory Visit (INDEPENDENT_AMBULATORY_CARE_PROVIDER_SITE_OTHER): Payer: Self-pay | Admitting: General Surgery

## 2013-10-25 DIAGNOSIS — N63 Unspecified lump in unspecified breast: Secondary | ICD-10-CM

## 2013-11-04 ENCOUNTER — Ambulatory Visit: Payer: Federal, State, Local not specified - PPO | Admitting: Physician Assistant

## 2013-11-06 ENCOUNTER — Encounter (INDEPENDENT_AMBULATORY_CARE_PROVIDER_SITE_OTHER): Payer: Self-pay

## 2013-11-06 ENCOUNTER — Ambulatory Visit (INDEPENDENT_AMBULATORY_CARE_PROVIDER_SITE_OTHER): Payer: Medicare Other | Admitting: Psychiatry

## 2013-11-06 ENCOUNTER — Encounter (HOSPITAL_COMMUNITY): Payer: Self-pay | Admitting: Psychiatry

## 2013-11-06 VITALS — BP 115/75 | HR 62 | Ht 67.0 in | Wt 193.0 lb

## 2013-11-06 DIAGNOSIS — F39 Unspecified mood [affective] disorder: Secondary | ICD-10-CM | POA: Diagnosis not present
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
## 2013-11-07 ENCOUNTER — Ambulatory Visit (INDEPENDENT_AMBULATORY_CARE_PROVIDER_SITE_OTHER): Payer: Federal, State, Local not specified - PPO | Admitting: Physician Assistant

## 2013-11-07 ENCOUNTER — Encounter: Payer: Self-pay | Admitting: Physician Assistant

## 2013-11-07 VITALS — BP 108/82 | HR 75 | Temp 97.6°F | Resp 16 | Ht 67.0 in | Wt 193.2 lb

## 2013-11-07 DIAGNOSIS — F411 Generalized anxiety disorder: Secondary | ICD-10-CM

## 2013-11-07 DIAGNOSIS — G4700 Insomnia, unspecified: Secondary | ICD-10-CM

## 2013-11-07 DIAGNOSIS — R350 Frequency of micturition: Secondary | ICD-10-CM

## 2013-11-07 DIAGNOSIS — N23 Unspecified renal colic: Secondary | ICD-10-CM

## 2013-11-07 DIAGNOSIS — R309 Painful micturition, unspecified: Secondary | ICD-10-CM

## 2013-11-07 DIAGNOSIS — F419 Anxiety disorder, unspecified: Secondary | ICD-10-CM

## 2013-11-07 LAB — POCT URINALYSIS DIPSTICK
Blood, UA: NEGATIVE
Glucose, UA: NEGATIVE
Leukocytes, UA: NEGATIVE
Nitrite, UA: NEGATIVE
Spec Grav, UA: 1.025
Urobilinogen, UA: 0.2

## 2013-11-07 MED ORDER — ESZOPICLONE 1 MG PO TABS: 1.0000 mg | ORAL_TABLET | Freq: Every evening | ORAL | Status: DC | PRN

## 2013-11-07 NOTE — Progress Notes (Signed)
Pre visit review using our clinic review tool, if applicable. No additional management support is needed unless otherwise documented below in the visit note/SLS  

## 2013-11-07 NOTE — Patient Instructions (Signed)
Please stop Remus Loffler and begin taking the [place].  Please continue with other medications.  Please go to your testing appointment with Dr. Wylene Simmer.  I have heard wonderful things about that physician.  Please return for labs.  Follow-up in 1 month for symptom reassessment

## 2013-11-08 LAB — URINALYSIS, ROUTINE W REFLEX MICROSCOPIC
Glucose, UA: NEGATIVE mg/dL
Hgb urine dipstick: NEGATIVE
Ketones, ur: NEGATIVE mg/dL
Leukocytes, UA: NEGATIVE
Nitrite: NEGATIVE
Urobilinogen, UA: 0.2 mg/dL (ref 0.0–1.0)
pH: 5 (ref 5.0–8.0)

## 2013-11-08 LAB — CULTURE, URINE COMPREHENSIVE
Colony Count: NO GROWTH
Organism ID, Bacteria: NO GROWTH

## 2013-11-12 DIAGNOSIS — R350 Frequency of micturition: Secondary | ICD-10-CM | POA: Insufficient documentation

## 2013-11-12 NOTE — Assessment & Plan Note (Signed)
Instructed patient to please followup with her urologist. Urinalysis has been negative several times in the past. We'll send a urine for micro and culture. I do not feel that her symptoms are coming from a UTI.
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
## 2013-11-12 NOTE — Assessment & Plan Note (Signed)
Encourage patient to proceed with neuropsychiatry testing. We'll make referral to a different psychiatrist. I feel that patient is not going to be happy with any psychiatrist, as patient does not have good insight into her mental health. She feels she is suffering from anxiety, but I question a much more complex psychiatric illness.

## 2013-11-27 ENCOUNTER — Other Ambulatory Visit: Payer: Self-pay | Admitting: Physician Assistant

## 2013-11-28 NOTE — Telephone Encounter (Signed)
Rx request to pharmacy/SLS  

## 2013-12-04 ENCOUNTER — Ambulatory Visit: Payer: Self-pay | Admitting: Physician Assistant

## 2013-12-09 ENCOUNTER — Encounter (HOSPITAL_BASED_OUTPATIENT_CLINIC_OR_DEPARTMENT_OTHER): Payer: Self-pay | Admitting: *Deleted

## 2013-12-09 NOTE — Progress Notes (Signed)
Did finally reach pt at home-reviewed instructions

## 2013-12-11 ENCOUNTER — Ambulatory Visit
Admission: RE | Admit: 2013-12-11 | Discharge: 2013-12-11 | Disposition: A | Discharge status: Home / Self Care | Payer: Federal, State, Local not specified - PPO | Source: Ambulatory Visit | Attending: General Surgery | Admitting: General Surgery

## 2013-12-11 ENCOUNTER — Encounter (HOSPITAL_BASED_OUTPATIENT_CLINIC_OR_DEPARTMENT_OTHER)
Admission: RE | Disposition: A | Discharge status: Home / Self Care | Payer: Self-pay | Source: Ambulatory Visit | Attending: General Surgery

## 2013-12-11 ENCOUNTER — Ambulatory Visit (HOSPITAL_BASED_OUTPATIENT_CLINIC_OR_DEPARTMENT_OTHER): Payer: Federal, State, Local not specified - PPO | Admitting: Anesthesiology

## 2013-12-11 ENCOUNTER — Encounter (HOSPITAL_BASED_OUTPATIENT_CLINIC_OR_DEPARTMENT_OTHER): Payer: Federal, State, Local not specified - PPO | Admitting: Anesthesiology

## 2013-12-11 ENCOUNTER — Ambulatory Visit (HOSPITAL_BASED_OUTPATIENT_CLINIC_OR_DEPARTMENT_OTHER)
Admission: RE | Admit: 2013-12-11 | Discharge: 2013-12-11 | Disposition: A | Discharge status: Home / Self Care | Payer: Federal, State, Local not specified - PPO | Source: Ambulatory Visit | Attending: General Surgery | Admitting: General Surgery

## 2013-12-11 ENCOUNTER — Encounter (HOSPITAL_BASED_OUTPATIENT_CLINIC_OR_DEPARTMENT_OTHER): Payer: Self-pay | Admitting: *Deleted

## 2013-12-11 DIAGNOSIS — E282 Polycystic ovarian syndrome: Secondary | ICD-10-CM | POA: Insufficient documentation

## 2013-12-11 DIAGNOSIS — M129 Arthropathy, unspecified: Secondary | ICD-10-CM | POA: Insufficient documentation

## 2013-12-11 DIAGNOSIS — G43909 Migraine, unspecified, not intractable, without status migrainosus: Secondary | ICD-10-CM | POA: Insufficient documentation

## 2013-12-11 DIAGNOSIS — Z803 Family history of malignant neoplasm of breast: Secondary | ICD-10-CM | POA: Insufficient documentation

## 2013-12-11 DIAGNOSIS — Z87891 Personal history of nicotine dependence: Secondary | ICD-10-CM | POA: Insufficient documentation

## 2013-12-11 DIAGNOSIS — D249 Benign neoplasm of unspecified breast: Secondary | ICD-10-CM

## 2013-12-11 DIAGNOSIS — Z9884 Bariatric surgery status: Secondary | ICD-10-CM | POA: Insufficient documentation

## 2013-12-11 DIAGNOSIS — J45909 Unspecified asthma, uncomplicated: Secondary | ICD-10-CM | POA: Insufficient documentation

## 2013-12-11 DIAGNOSIS — N6310 Unspecified lump in the right breast, unspecified quadrant: Secondary | ICD-10-CM

## 2013-12-11 HISTORY — PX: BREAST LUMPECTOMY WITH NEEDLE LOCALIZATION: SHX5759

## 2013-12-11 LAB — POCT HEMOGLOBIN-HEMACUE: Hemoglobin: 13.7 g/dL (ref 12.0–15.0)

## 2013-12-11 SURGERY — BREAST LUMPECTOMY WITH NEEDLE LOCALIZATION
Anesthesia: Regional | Site: Breast | Laterality: Right

## 2013-12-11 MED ORDER — MIDAZOLAM HCL 2 MG/2ML IJ SOLN
INTRAMUSCULAR | Status: AC
  Filled 2013-12-11: qty 2

## 2013-12-11 MED ORDER — FENTANYL CITRATE 0.05 MG/ML IJ SOLN
50.0000 ug | INTRAMUSCULAR | Status: DC | PRN
  Administered 2013-12-11 (×2): 100 ug via INTRAVENOUS

## 2013-12-11 MED ORDER — EPHEDRINE SULFATE 50 MG/ML IJ SOLN
INTRAMUSCULAR | Status: DC | PRN
  Administered 2013-12-11: 10 mg via INTRAVENOUS

## 2013-12-11 MED ORDER — PROPOFOL 10 MG/ML IV BOLUS
INTRAVENOUS | Status: DC | PRN
  Administered 2013-12-11: 110 mg via INTRAVENOUS

## 2013-12-11 MED ORDER — PROPOFOL 10 MG/ML IV BOLUS
INTRAVENOUS | Status: AC
  Filled 2013-12-11: qty 20

## 2013-12-11 MED ORDER — OXYCODONE HCL 5 MG PO TABS
5.0000 mg | ORAL_TABLET | Freq: Once | ORAL | Status: AC | PRN
  Administered 2013-12-11: 5 mg via ORAL
  Filled 2013-12-11: qty 1

## 2013-12-11 MED ORDER — FENTANYL CITRATE 0.05 MG/ML IJ SOLN
INTRAMUSCULAR | Status: AC
  Filled 2013-12-11: qty 2

## 2013-12-11 MED ORDER — ROPIVACAINE HCL 5 MG/ML IJ SOLN
INTRAMUSCULAR | Status: DC | PRN
  Administered 2013-12-11: 30 mL

## 2013-12-11 MED ORDER — LACTATED RINGERS IV SOLN
INTRAVENOUS | Status: DC
  Administered 2013-12-11: 12:00:00 via INTRAVENOUS

## 2013-12-11 MED ORDER — OXYCODONE HCL 5 MG/5ML PO SOLN: 5.0000 mg | Freq: Once | ORAL | Status: AC | PRN

## 2013-12-11 MED ORDER — ONDANSETRON HCL 4 MG/2ML IJ SOLN
INTRAMUSCULAR | Status: DC | PRN
  Administered 2013-12-11: 4 mg via INTRAVENOUS

## 2013-12-11 MED ORDER — CHLORHEXIDINE GLUCONATE 4 % EX LIQD: 1.0000 "application " | Freq: Once | CUTANEOUS | Status: DC

## 2013-12-11 MED ORDER — HYDROCODONE-ACETAMINOPHEN 5-325 MG PO TABS: 1.0000 | ORAL_TABLET | ORAL | Status: DC | PRN

## 2013-12-11 MED ORDER — CIPROFLOXACIN IN D5W 400 MG/200ML IV SOLN
400.0000 mg | INTRAVENOUS | Status: AC
  Administered 2013-12-11: 400 mg via INTRAVENOUS

## 2013-12-11 MED ORDER — DEXAMETHASONE SODIUM PHOSPHATE 4 MG/ML IJ SOLN
INTRAMUSCULAR | Status: DC | PRN
  Administered 2013-12-11: 5 mg via INTRAVENOUS

## 2013-12-11 MED ORDER — BUPIVACAINE-EPINEPHRINE PF 0.5-1:200000 % IJ SOLN
INTRAMUSCULAR | Status: AC
  Filled 2013-12-11: qty 30

## 2013-12-11 MED ORDER — MIDAZOLAM HCL 2 MG/2ML IJ SOLN
1.0000 mg | INTRAMUSCULAR | Status: DC | PRN
  Administered 2013-12-11: 2 mg via INTRAVENOUS
  Administered 2013-12-11: 1 mg via INTRAVENOUS

## 2013-12-11 MED ORDER — LIDOCAINE HCL (CARDIAC) 20 MG/ML IV SOLN
INTRAVENOUS | Status: DC | PRN
  Administered 2013-12-11: 40 mg via INTRAVENOUS

## 2013-12-11 MED ORDER — HYDROMORPHONE HCL PF 1 MG/ML IJ SOLN
0.2500 mg | INTRAMUSCULAR | Status: DC | PRN
  Administered 2013-12-11 (×2): 0.25 mg via INTRAVENOUS

## 2013-12-11 MED ORDER — CIPROFLOXACIN IN D5W 400 MG/200ML IV SOLN
INTRAVENOUS | Status: AC
  Filled 2013-12-11: qty 200

## 2013-12-11 MED ORDER — HYDROMORPHONE HCL PF 1 MG/ML IJ SOLN
INTRAMUSCULAR | Status: AC
  Filled 2013-12-11: qty 1

## 2013-12-11 MED ORDER — FENTANYL CITRATE 0.05 MG/ML IJ SOLN
INTRAMUSCULAR | Status: AC
Start: 2013-12-11 — End: 2013-12-11
  Filled 2013-12-11: qty 4

## 2013-12-11 SURGICAL SUPPLY — 48 items
BLADE SURG 10 STRL SS (BLADE) IMPLANT
BLADE SURG 15 STRL LF DISP TIS (BLADE) ×1 IMPLANT
BLADE SURG 15 STRL SS (BLADE) ×2
CANISTER SUCT 1200ML W/VALVE (MISCELLANEOUS) IMPLANT
CHLORAPREP W/TINT 26ML (MISCELLANEOUS) ×3 IMPLANT
CLIP TI MEDIUM 6 (CLIP) IMPLANT
CLIP TI WIDE RED SMALL 6 (CLIP) IMPLANT
COVER MAYO STAND STRL (DRAPES) ×3 IMPLANT
COVER TABLE BACK 60X90 (DRAPES) ×3 IMPLANT
DERMABOND ADVANCED (GAUZE/BANDAGES/DRESSINGS) ×2
DERMABOND ADVANCED .7 DNX12 (GAUZE/BANDAGES/DRESSINGS) ×1 IMPLANT
DEVICE DUBIN W/COMP PLATE 8390 (MISCELLANEOUS) ×3 IMPLANT
DRAPE PED LAPAROTOMY (DRAPES) ×3 IMPLANT
DRAPE UTILITY XL STRL (DRAPES) ×3 IMPLANT
ELECT COATED BLADE 2.86 ST (ELECTRODE) ×3 IMPLANT
ELECT REM PT RETURN 9FT ADLT (ELECTROSURGICAL) ×3
ELECTRODE REM PT RTRN 9FT ADLT (ELECTROSURGICAL) ×1 IMPLANT
GLOVE BIOGEL PI IND STRL 7.0 (GLOVE) ×1 IMPLANT
GLOVE BIOGEL PI IND STRL 8 (GLOVE) ×1 IMPLANT
GLOVE BIOGEL PI INDICATOR 7.0 (GLOVE) ×2
GLOVE BIOGEL PI INDICATOR 8 (GLOVE) ×2
GLOVE ECLIPSE 6.5 STRL STRAW (GLOVE) ×3 IMPLANT
GLOVE SS BIOGEL STRL SZ 7.5 (GLOVE) ×1 IMPLANT
GLOVE SUPERSENSE BIOGEL SZ 7.5 (GLOVE) ×2
GOWN STRL REUS W/ TWL LRG LVL3 (GOWN DISPOSABLE) ×1 IMPLANT
GOWN STRL REUS W/ TWL XL LVL3 (GOWN DISPOSABLE) ×1 IMPLANT
GOWN STRL REUS W/TWL LRG LVL3 (GOWN DISPOSABLE) ×2
GOWN STRL REUS W/TWL XL LVL3 (GOWN DISPOSABLE) ×2
KIT MARKER MARGIN INK (KITS) IMPLANT
NEEDLE HYPO 25X1 1.5 SAFETY (NEEDLE) ×3 IMPLANT
NS IRRIG 1000ML POUR BTL (IV SOLUTION) IMPLANT
PACK BASIN DAY SURGERY FS (CUSTOM PROCEDURE TRAY) ×3 IMPLANT
PENCIL BUTTON HOLSTER BLD 10FT (ELECTRODE) ×3 IMPLANT
SLEEVE SCD COMPRESS KNEE MED (MISCELLANEOUS) ×3 IMPLANT
STAPLER VISISTAT 35W (STAPLE) IMPLANT
SUT MON AB 3-0 SH 27 (SUTURE)
SUT MON AB 3-0 SH27 (SUTURE) IMPLANT
SUT MON AB 5-0 PS2 18 (SUTURE) ×3 IMPLANT
SUT SILK 3 0 SH 30 (SUTURE) IMPLANT
SUT VIC AB 4-0 BRD 54 (SUTURE) IMPLANT
SUT VICRYL 3-0 CR8 SH (SUTURE) ×3 IMPLANT
SYR BULB 3OZ (MISCELLANEOUS) IMPLANT
SYR CONTROL 10ML LL (SYRINGE) ×3 IMPLANT
TOWEL OR 17X24 6PK STRL BLUE (TOWEL DISPOSABLE) ×3 IMPLANT
TOWEL OR NON WOVEN STRL DISP B (DISPOSABLE) IMPLANT
TUBE CONNECTING 20'X1/4 (TUBING) ×1
TUBE CONNECTING 20X1/4 (TUBING) ×2 IMPLANT
YANKAUER SUCT BULB TIP NO VENT (SUCTIONS) ×3 IMPLANT

## 2013-12-11 NOTE — Progress Notes (Signed)
Assisted Dr. Ola Spurr with right, ultrasound guided pectoralis block. Side rails up, monitors on throughout procedure. See vital signs in flow sheet. Tolerated Procedure well.

## 2013-12-11 NOTE — Discharge Instructions (Addendum)
°Post Anesthesia Home Care Instructions ° °Activity: °Get plenty of rest for the remainder of the day. A responsible adult should stay with you for 24 hours following the procedure.  °For the next 24 hours, DO NOT: °-Drive a car °-Operate machinery °-Drink alcoholic beverages °-Take any medication unless instructed by your physician °-Make any legal decisions or sign important papers. ° °Meals: °Start with liquid foods such as gelatin or soup. Progress to regular foods as tolerated. Avoid greasy, spicy, heavy foods. If nausea and/or vomiting occur, drink only clear liquids until the nausea and/or vomiting subsides. Call your physician if vomiting continues. ° °Special Instructions/Symptoms: °Your throat may feel dry or sore from the anesthesia or the breathing tube placed in your throat during surgery. If this causes discomfort, gargle with warm salt water. The discomfort should disappear within 24 hours. ° ° ° ° ° ° ° °[facility] °Office Phone Number [phone] ° °BREAST BIOPSY/ PARTIAL MASTECTOMY: POST OP INSTRUCTIONS ° °Always review your discharge instruction sheet given to you by the facility where your surgery was performed. ° °IF YOU HAVE DISABILITY OR FAMILY LEAVE FORMS, YOU MUST BRING THEM TO THE OFFICE FOR PROCESSING.  DO NOT GIVE THEM TO YOUR DOCTOR. ° °1. A prescription for pain medication may be given to you upon discharge.  Take your pain medication as prescribed, if needed.  If narcotic pain medicine is not needed, then you may take acetaminophen (Tylenol) or ibuprofen (Advil) as needed. °2. Take your usually prescribed medications unless otherwise directed °3. If you need a refill on your pain medication, please contact your pharmacy.  They will contact our office to request authorization.  Prescriptions will not be filled after 5pm or on week-ends. °4. You should eat very light the first 24 hours after surgery, such as soup, crackers, pudding, etc.  Resume your normal diet the  day after surgery. °5. Most patients will experience some swelling and bruising in the breast.  Ice packs and a good support bra will help.  Swelling and bruising can take several days to resolve.  °6. It is common to experience some constipation if taking pain medication after surgery.  Increasing fluid intake and taking a stool softener will usually help or prevent this problem from occurring.  A mild laxative (Milk of Magnesia or Miralax) should be taken according to package directions if there are no bowel movements after 48 hours. °7. Unless discharge instructions indicate otherwise, you may remove your bandages 24-48 hours after surgery, and you may shower at that time.  You may have steri-strips (small skin tapes) in place directly over the incision.  These strips should be left on the skin for 7-10 days.  If your surgeon used skin glue on the incision, you may shower in 24 hours.  The glue will flake off over the next 2-3 weeks.  Any sutures or staples will be removed at the office during your follow-up visit. °8. ACTIVITIES:  You may resume regular daily activities (gradually increasing) beginning the next day.  Wearing a good support bra or sports bra minimizes pain and swelling.  You may have sexual intercourse when it is comfortable. °a. You may drive when you no longer are taking prescription pain medication, you can comfortably wear a seatbelt, and you can safely maneuver your car and apply brakes. °b. RETURN TO WORK:  ______________________________________________________________________________________ °9. You should see your doctor in the office for a follow-up appointment approximately two weeks after your surgery.  Your doctor’s nurse will   when she calls you with your pathology report.  Expect your pathology report 2-3 business days after your surgery.  You may call to check if you do not hear from [place] after three days. 10. OTHER INSTRUCTIONS:  _______________________________________________________________________________________________ _____________________________________________________________________________________________________________________________________ _____________________________________________________________________________________________________________________________________ _____________________________________________________________________________________________________________________________________  WHEN TO CALL YOUR DOCTOR: 1. Fever over 101.0 2. Nausea and/or vomiting. 3. Extreme swelling or bruising. 4. Continued bleeding from incision. 5. Increased pain, redness, or drainage from the incision.  The clinic staff is available to answer your questions during regular business hours.  Please dont hesitate to call and ask to speak to one of the nurses for clinical concerns.  If you have a medical emergency, go to the nearest emergency room or call 911.  A surgeon from [facility] is always on call at the hospital.  For further questions, please visit [email or web address]

## 2013-12-11 NOTE — H&P (Signed)
Subjective:   right breast mass, sclerosing lesion  Patient ID: Kathleen Ware, female DOB: [birth year], 43 y.o. MRN: [id number]  HPI  Patient is a 43 year old female referred by Dr.Jarosz for a recent diagnosis of sclerosing lesion of the right breast. The patient is the daughter of a breast cancer patient of mine. She has no personal history of breast cancer. She does have a history of reduction mammoplasty several years ago. She has a history of benign cysts which have been seen on imaging but no previous biopsies. She recently presented for a screening mammogram. Last had been about 5 years previously. A mass in the medial right breast was noted and diagnostic mammogram and ultrasound recommended. This confirmed a mass. Ultrasound revealed a 5 mm hypoechoic somewhat suspicious mass in the medial aspect of the right breast 3:00 position 3 cm from the nipple. Ultrasound also noted what appeared to be a small cluster of microcysts at the 4:00 position 5 cm from the nipple. Large core needle biopsy of the mass was recommended and accepted. This was performed and has revealed a complex sclerosing lesion. The patient was referred for complete excision. She has some occasional breast soreness but no masses or nipple discharge.  Past Medical History   Diagnosis  Date   .  Thyroid disease    .  Asthma    .  PCOS (polycystic ovarian syndrome)    .  Migraine    .  Anemia    .  Arthritis     Past Surgical History   Procedure  Laterality  Date   .  Gastric bypass     .  Thyroidectomy, partial     .  Abdominal surgery     .  Colonoscopy      Current Outpatient Prescriptions   Medication  Sig  Dispense  Refill   .  butalbital-aspirin-caffeine-codeine (FIORINAL WITH CODEINE) 50-325-40-30 MG capsule  Take 1 capsule by mouth every 4 (four) hours as needed. For migraines  30 capsule  0   .  levothyroxine (SYNTHROID, LEVOTHROID) 150 MCG tablet  Take 1 tablet (150 mcg total) by mouth daily.  90 tablet  3    .  metFORMIN (GLUCOPHAGE) 500 MG tablet  Take 1 tablet (500 mg total) by mouth 2 (two) times daily.  60 tablet  0   .  zolpidem (AMBIEN) 5 MG tablet  Take 1 tablet (5 mg total) by mouth at bedtime as needed for sleep.  30 tablet  0   .  albuterol (PROVENTIL HFA;VENTOLIN HFA) 108 (90 BASE) MCG/ACT inhaler  Inhale 1-2 puffs into the lungs every 6 (six) hours as needed for wheezing.  1 Inhaler  0   .  aspirin-acetaminophen-caffeine (EXCEDRIN MIGRAINE) 401-027-25 MG per tablet  Take 1 tablet by mouth every 6 (six) hours as needed. For migraine     .  SUMAtriptan (IMITREX) 50 MG tablet  Take 1 tablet (50 mg total) by mouth once. May repeat in 2 hours if headache persists or recurs.  10 tablet  0    No current facility-administered medications for this visit.    Allergies   Allergen  Reactions   .  Penicillins  Anaphylaxis   .  Iodine  Other (See Comments)     wheezing   .  Sulfa Antibiotics  Hives    History   Substance Use Topics   .  Smoking status:  Former Research scientist (life sciences)   .  Smokeless tobacco:  Not on file   .  Alcohol Use:  No   Review of Systems  Constitutional: Negative.  Respiratory: Negative.  Cardiovascular: Negative.  Objective:   Physical Exam  BP 117/82  Pulse 65  Temp(Src) 97.7 F (36.5 C) (Oral)  Resp 14  Ht 5\' 7"  (1.702 m)  Wt 189 lb 6 oz (85.9 kg)  BMI 29.65 kg/m2  SpO2 98%  LMP 11/19/2013  General: Alert, Moderately obese Caucasian female., in no distress  Skin: Warm and dry without rash or infection.  HEENT: No palpable masses or thyromegaly. Sclera nonicteric. Pupils equal round and reactive. Oropharynx clear.  Breasts: Healed mammoplasty incisions. No palpable masses or skin changes.  Lymph nodes: No cervical, supraclavicular, or inguinal nodes palpable.  Lungs: Breath sounds clear and equal without increased work of breathing  Cardiovascular: Regular rate and rhythm without murmur. No JVD or edema. Peripheral pulses intact.  Neurologic: Alert and fully oriented.  Gait normal.  Assessment:   Recent core biopsy of 5 mm mass medial right breast showing complex sclerosing lesion. I reviewed the findings with the patient in detail. I agree with complete excision to rule out the small chance of underlying malignancy particularly with her family history of breast cancer in her mother. She is concerned about the area microcysts and 6 month followup was recommended for this. I told her I did not feel that surgical excision was warranted for this and she understands and agrees. Discuss the nature of the surgery and expected recovery and indications as well as risks of anesthetic complications, bleeding and infection. All her questions were answered.  Plan:   Needle localized right breast lumpectomy under general anesthesia as an outpatient

## 2013-12-11 NOTE — Anesthesia Procedure Notes (Addendum)
Anesthesia Regional Block:  Pectoralis block  Pre-Anesthetic Checklist: ,, timeout performed, Correct Patient, Correct Site, Correct Laterality, Correct Procedure, Correct Position, site marked, Risks and benefits discussed, pre-op evaluation, post-op pain management  Laterality: Right  Prep: chloraprep       Needles:  Injection technique: Single-shot  Needle Type: Echogenic Stimulator Needle      Needle Gauge: 21 and 21 G    Additional Needles:  Procedures: ultrasound guided (picture in chart) Pectoralis block Narrative:  Start time: 12/11/2013 11:49 AM End time: 12/11/2013 12:10 PM Injection made incrementally with aspirations every 5 mL.  Performed by: Personally  Anesthesiologist: Fitzgerald,MD   Procedure Name: LMA Insertion Date/Time: 12/11/2013 12:26 PM Performed by: Maryella Shivers Pre-anesthesia Checklist: Patient identified, Emergency Drugs available, Suction available and Patient being monitored Patient Re-evaluated:Patient Re-evaluated prior to inductionOxygen Delivery Method: Circle System Utilized Preoxygenation: Pre-oxygenation with 100% oxygen Intubation Type: IV induction Ventilation: Mask ventilation without difficulty LMA: LMA inserted LMA Size: 4.0 Number of attempts: 1 Airway Equipment and Method: bite block Placement Confirmation: positive ETCO2 Tube secured with: Tape Dental Injury: Teeth and Oropharynx as per pre-operative assessment

## 2013-12-11 NOTE — Anesthesia Preprocedure Evaluation (Addendum)
Anesthesia Evaluation  Patient identified by MRN, date of birth, ID band Patient awake    Reviewed: Allergy & Precautions, H&P , NPO status , Patient's Chart, lab work & pertinent test results  Airway Mallampati: II TM Distance: >3 FB Neck ROM: Full    Dental no notable dental hx. (+) Teeth Intact and Dental Advisory Given   Pulmonary neg pulmonary ROS, asthma , former smoker,  breath sounds clear to auscultation  Pulmonary exam normal       Cardiovascular negative cardio ROS  Rhythm:Regular Rate:Normal     Neuro/Psych  Headaches, negative neurological ROS  negative psych ROS   GI/Hepatic negative GI ROS, Neg liver ROS,   Endo/Other  negative endocrine ROSHypothyroidism   Renal/GU negative Renal ROS  negative genitourinary   Musculoskeletal   Abdominal   Peds  Hematology negative hematology ROS (+)   Anesthesia Other Findings   Reproductive/Obstetrics negative OB ROS                          Anesthesia Physical Anesthesia Plan  ASA: II  Anesthesia Plan: General and Regional   Post-op Pain Management:    Induction: Intravenous  Airway Management Planned: LMA  Additional Equipment:   Intra-op Plan:   Post-operative Plan: Extubation in OR  Informed Consent: I have reviewed the patients History and Physical, chart, labs and discussed the procedure including the risks, benefits and alternatives for the proposed anesthesia with the patient or authorized representative who has indicated his/her understanding and acceptance.   Dental advisory given  Plan Discussed with: CRNA  Anesthesia Plan Comments:        Anesthesia Quick Evaluation

## 2013-12-11 NOTE — Anesthesia Postprocedure Evaluation (Signed)
  Anesthesia Post-op Note  Patient: Kathleen Ware  Procedure(s) Performed: Procedure(s): BREAST LUMPECTOMY WITH NEEDLE LOCALIZATION (Right)  Patient Location: PACU  Anesthesia Type:General and PECS block  Level of Consciousness: awake and alert   Airway and Oxygen Therapy: Patient Spontanous Breathing  Post-op Pain: mild  Post-op Assessment: Post-op Vital signs reviewed, Patient's Cardiovascular Status Stable and Respiratory Function Stable  Post-op Vital Signs: Reviewed  Filed Vitals:   12/11/13 1415  BP: 130/78  Pulse: 82  Temp: 36.8 C  Resp: 16    Complications: No apparent anesthesia complications

## 2013-12-11 NOTE — Transfer of Care (Signed)
Immediate Anesthesia Transfer of Care Note  Patient: Kathleen Ware  Procedure(s) Performed: Procedure(s): BREAST LUMPECTOMY WITH NEEDLE LOCALIZATION (Right)  Patient Location: PACU  Anesthesia Type:GA combined with regional for post-op pain  Level of Consciousness: awake, alert  and oriented  Airway & Oxygen Therapy: Patient Spontanous Breathing and Patient connected to face mask oxygen  Post-op Assessment: Report given to PACU RN and Post -op Vital signs reviewed and stable  Post vital signs: Reviewed and stable  Complications: No apparent anesthesia complications

## 2013-12-11 NOTE — Op Note (Signed)
Preoperative Diagnosis: right breast mass, status post core needle biopsy showing sclerosing lesion  Postoprative Diagnosis: same  Procedure: Procedure(s): BREAST LUMPECTOMY WITH NEEDLE LOCALIZATION   Surgeon: Excell Seltzer T   Assistants: none  Anesthesia:  General LMA anesthesia  Indications: patient is a 43 year old female with a family history of breast cancer who on a recent screening mammogram was found to have a small mass in the medial right breast. Large core needle biopsy was performed revealing a sclerosing lesion. Surgical excision has been recommended and accepted. The procedure and its indications and risks have been discussed in detailed elsewhere  Procedure Detail:  Following accurate needle localization the patient was brought to the operating room, placed in the supine position on the operating table, and laryngeal mask general anesthesia induced. The right breast was widely sterilely prepped and draped. Patient timeout was performed and correct procedure verified. The wire insertion site was in the medial breast several centimeters from the areolar border. I used a circumareolar incision on the medial aspect of the areola using the patient's previous breast reduction incision. The skin and subcutaneous flap was then raised out toward the wire insertion site and the wire was encountered and brought into the incision. I then excised with cautery a specimen of tissue around the shaft of the wire down toward the tip and the specimen and wire were removed intact. The specimen was oriented with sutures. Specimen mammography showed the wire and clip apparently in the center of the specimen. Hemostasis was assured. The deep breast the subcutaneous tissue was closed with interrupted 3-0 Vicryl. The skin was closed with a running subcuticular 4-0 Monocryl and Dermabond. Sponge needle and instrument counts were correct.   Estimated Blood Loss:  Minimal         Drains: none  Blood  Given: none          Specimens: right breast tissue        Complications:  * No complications entered in OR log *         Disposition: PACU - hemodynamically stable.         Condition: stable

## 2013-12-12 ENCOUNTER — Encounter (HOSPITAL_BASED_OUTPATIENT_CLINIC_OR_DEPARTMENT_OTHER): Payer: Self-pay | Admitting: General Surgery

## 2013-12-13 ENCOUNTER — Telehealth (INDEPENDENT_AMBULATORY_CARE_PROVIDER_SITE_OTHER): Payer: Self-pay

## 2013-12-13 NOTE — Telephone Encounter (Signed)
Called patient to give pathology results.  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  Patient having dizziness, hot flashes and mild nausea.  Patient offered appointment for today to see Dr. Excell Seltzer for further assessment.  Patient declined the appointment at this time. Patient states she will call her PCP for further assessment of her symptoms.  Patient reports her incision is healing well without redness, swelling or drainage.  Patient advised to call our office if she has any questions or concerns.  Patient states she's having some pain at her incision site, patient advised this is normal.  Patient to call if symptoms worsen or change.  Patient verbalized understanding.

## 2013-12-14 IMAGING — CR DG CHEST 2V
2 series · 2 of 2 positions shown · non-contrast
Comparison: November 04, 2012

CLINICAL DATA: Cough

CHEST - 2 VIEW

[w chest pa]
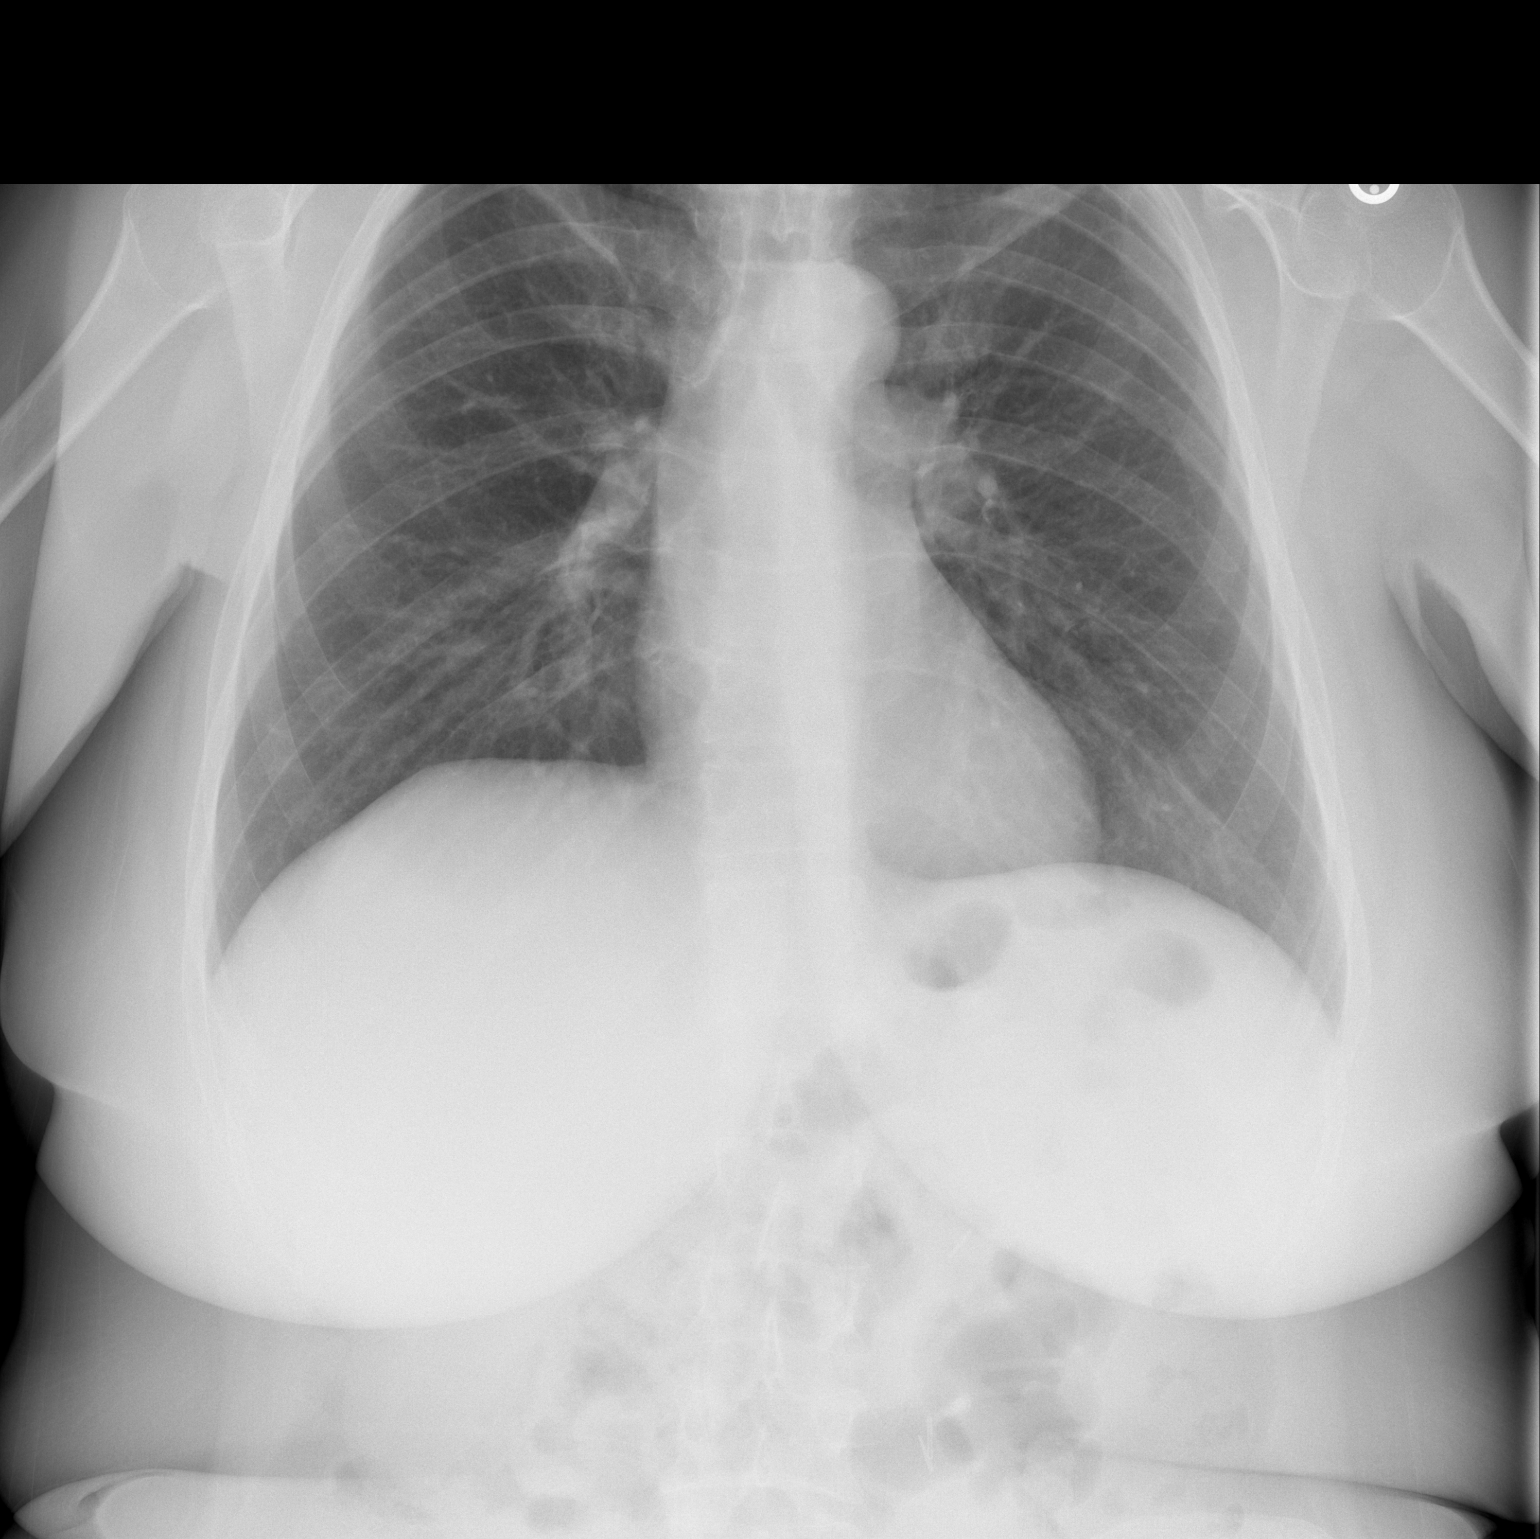

[w chest lat]
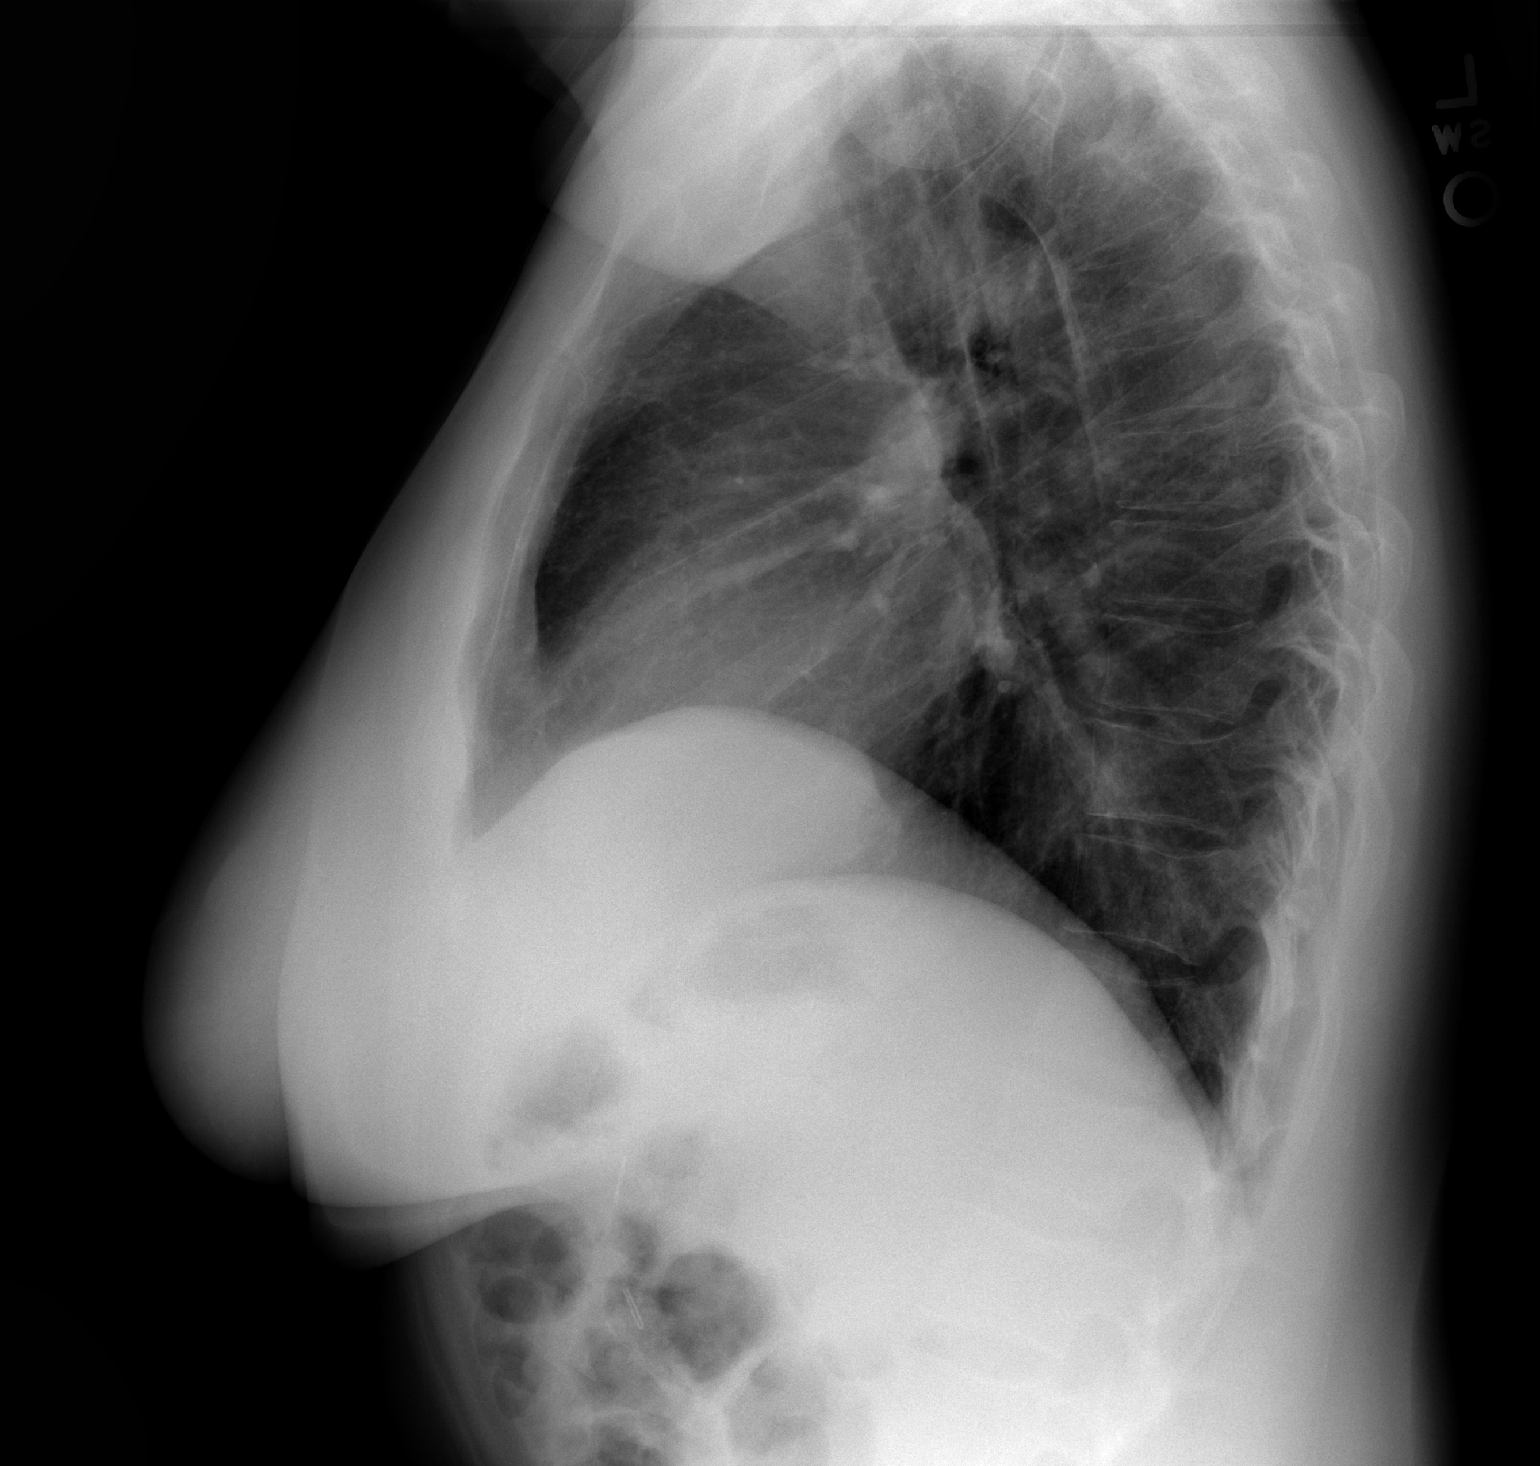

[2 of 2 positions shown; findings below may reference images not displayed]

FINDINGS: Lungs clear.  Heart size and pulmonary vascularity are
normal.  No adenopathy.  No bone lesions.
IMPRESSION: No abnormality noted.

## 2013-12-18 ENCOUNTER — Ambulatory Visit: Payer: Federal, State, Local not specified - PPO | Admitting: Physician Assistant

## 2013-12-25 ENCOUNTER — Ambulatory Visit: Payer: Medicare Other | Admitting: Physician Assistant

## 2013-12-25 ENCOUNTER — Ambulatory Visit (INDEPENDENT_AMBULATORY_CARE_PROVIDER_SITE_OTHER): Payer: Medicare Other | Admitting: Physician Assistant

## 2013-12-25 ENCOUNTER — Encounter: Payer: Self-pay | Admitting: Physician Assistant

## 2013-12-25 VITALS — BP 100/68 | HR 80 | Temp 97.4°F | Resp 16 | Ht 67.0 in | Wt 190.2 lb

## 2013-12-25 DIAGNOSIS — R350 Frequency of micturition: Secondary | ICD-10-CM

## 2013-12-25 DIAGNOSIS — G43909 Migraine, unspecified, not intractable, without status migrainosus: Secondary | ICD-10-CM | POA: Diagnosis not present

## 2013-12-25 DIAGNOSIS — E039 Hypothyroidism, unspecified: Secondary | ICD-10-CM

## 2013-12-25 DIAGNOSIS — R6889 Other general symptoms and signs: Secondary | ICD-10-CM

## 2013-12-25 DIAGNOSIS — J329 Chronic sinusitis, unspecified: Secondary | ICD-10-CM

## 2013-12-25 DIAGNOSIS — R899 Unspecified abnormal finding in specimens from other organs, systems and tissues: Secondary | ICD-10-CM

## 2013-12-25 LAB — POCT URINALYSIS DIPSTICK
Bilirubin, UA: NEGATIVE
Blood, UA: NEGATIVE
Glucose, UA: NEGATIVE
Ketones, UA: NEGATIVE
Leukocytes, UA: NEGATIVE
NITRITE UA: NEGATIVE
Protein, UA: NEGATIVE
Urobilinogen, UA: 0.2
pH, UA: 6

## 2013-12-25 MED ORDER — BUTALBITAL-ASA-CAFF-CODEINE 50-325-40-30 MG PO CAPS: 1.0000 | ORAL_CAPSULE | ORAL | Status: DC | PRN

## 2013-12-25 MED ORDER — LEVOFLOXACIN 500 MG PO TABS: 500.0000 mg | ORAL_TABLET | Freq: Every day | ORAL | Status: DC

## 2013-12-25 NOTE — Assessment & Plan Note (Signed)
Refill medications

## 2013-12-25 NOTE — Progress Notes (Signed)
Pre visit review using our clinic review tool, if applicable. No additional management support is needed unless otherwise documented below in the visit note/SLS  

## 2013-12-25 NOTE — Assessment & Plan Note (Signed)
Referral to Urology placed.  Urine dipstick and physical examination is, again, unremarkable.  Will send for culture.

## 2013-12-25 NOTE — Patient Instructions (Addendum)
Continue medications as prescribed.  You will be contacted by Urology and ENT.  I will call you with your lab results.   For sinus symptoms -- Take Levaquin as prescribed.  Increase fluid intake.  Rest.  Saline nasal spray. Mucinex. Humidifier in bedroom. You will be contacted by ENT

## 2013-12-25 NOTE — Assessment & Plan Note (Signed)
Take antibiotic as prescribed.  Increase fluid intake.  Rest.  Saline nasal spray. Mucinex. Humidifier in bedroom. Follow-up with ENT for further evaluation.  Please call or return to clinic if symptoms are not improving.

## 2013-12-25 NOTE — Progress Notes (Signed)
Patient presents to clinic today for follow-up of migraines.  Patient needs a refill of her Fiorinal.  Patient has tried several medication in the past, including multiple Triptans with little relief of her migraines.  Patient endorses ~ 1 migraine every 1-2 weeks.  Patient usually takes an excedrin migraine for symptoms relief.  Will use Fiorinal only for severe headache.  Patient denies worsening of her symptoms.  Patient is requesting referral to ENT for recurrent sinusitis.  Patient c/o 2 weeks of sinus pressure, sinus pain and postnasal drip.  Denies fever.  Denies ear pain or tooth pain.  Denies cough, SOB or wheezing.  Patient denies recent travel or sick contact.  Has been evaluated by an ENT before but patient does not wish to receive her care from that specialist.   Patient also needs referral replaced to Urology.  Consistently c/o urinary urgency although physical exam and urinalysis are always unremarkable.  Referral had been placed previously but patient canceled referral.    Past Medical History  Diagnosis Date  . Thyroid disease   . Asthma   . PCOS (polycystic ovarian syndrome)     takes metformin for this  . Migraine   . Anemia   . Arthritis   . Wears glasses     Current Outpatient Prescriptions on File Prior to Visit  Medication Sig Dispense Refill  . albuterol (PROVENTIL HFA;VENTOLIN HFA) 108 (90 BASE) MCG/ACT inhaler Inhale 1-2 puffs into the lungs every 6 (six) hours as needed for wheezing.  1 Inhaler  0  . aspirin-acetaminophen-caffeine (EXCEDRIN MIGRAINE) 035-009-38 MG per tablet Take 1 tablet by mouth every 6 (six) hours as needed. For migraine      . eszopiclone (LUNESTA) 1 MG TABS tablet Take 1 tablet (1 mg total) by mouth at bedtime as needed for sleep. Take immediately before bedtime  30 tablet  1  . HYDROcodone-acetaminophen (NORCO/VICODIN) 5-325 MG per tablet Take 1-2 tablets by mouth every 4 (four) hours as needed.  30 tablet  0  . levothyroxine (SYNTHROID,  LEVOTHROID) 150 MCG tablet Take 1 tablet (150 mcg total) by mouth daily.  90 tablet  3  . metFORMIN (GLUCOPHAGE) 500 MG tablet TAKE 1 TABLET BY MOUTH TWICE DAILY  60 tablet  2   No current facility-administered medications on file prior to visit.    Allergies  Allergen Reactions  . Penicillins Anaphylaxis  . Iodine Other (See Comments)    wheezing  . Sulfa Antibiotics Hives    Family History  Problem Relation Age of Onset  . Cancer Mother   . Hypertension Mother   . Hyperlipidemia Mother   . Hypertension Father   . Cancer Paternal Grandfather     lung    History   Social History  . Marital Status: Single    Spouse Name: N/A    Number of Children: N/A  . Years of Education: N/A   Social History Main Topics  . Smoking status: Former Smoker    Quit date: 10/10/2006  . Smokeless tobacco: None  . Alcohol Use: No  . Drug Use: No  . Sexual Activity: None   Other Topics Concern  . None   Social History Narrative  . None   Review of Systems - See HPI.  All other ROS are negative.  BP 100/68  Pulse 80  Temp(Src) 97.4 F (36.3 C) (Oral)  Resp 16  Ht 5\' 7"  (1.702 m)  Wt 190 lb 4 oz (86.297 kg)  BMI 29.79 kg/m2  SpO2  97%  LMP 12/18/2013  Physical Exam  Vitals reviewed. Constitutional: She is oriented to person, place, and time and well-developed, well-nourished, and in no distress.  HENT:  Head: Normocephalic and atraumatic.  Eyes: Conjunctivae are normal. Pupils are equal, round, and reactive to light.  Neck: Neck supple.  Cardiovascular: Normal rate, regular rhythm, normal heart sounds and intact distal pulses.   Pulmonary/Chest: Effort normal and breath sounds normal. No respiratory distress. She has no wheezes. She has no rales. She exhibits no tenderness.  Lymphadenopathy:    She has no cervical adenopathy.  Neurological: She is alert and oriented to person, place, and time.  Skin: Skin is warm and dry. No rash noted.  Psychiatric: Affect normal.     Recent Results (from the past 2160 hour(s))  POCT URINALYSIS DIPSTICK     Status: None   Collection Time    11/07/13  3:14 PM      Result Value Range   Color, UA dark gold     Clarity, UA murky     Glucose, UA neg     Bilirubin, UA small     Ketones, UA trace     Spec Grav, UA 1.025     Blood, UA neg     pH, UA 5.0     Protein, UA neg     Urobilinogen, UA 0.2     Nitrite, UA neg     Leukocytes, UA Negative    CULTURE, URINE COMPREHENSIVE     Status: None   Collection Time    11/07/13  3:17 PM      Result Value Range   Colony Count NO GROWTH     Organism ID, Bacteria NO GROWTH    URINALYSIS, ROUTINE W REFLEX MICROSCOPIC     Status: None   Collection Time    11/07/13  3:17 PM      Result Value Range   Color, Urine YELLOW  YELLOW   APPearance CLEAR  CLEAR   Specific Gravity, Urine 1.025  1.005 - 1.030   pH 5.0  5.0 - 8.0   Glucose, UA NEG  NEG mg/dL   Bilirubin Urine NEG  NEG   Ketones, ur NEG  NEG mg/dL   Hgb urine dipstick NEG  NEG   Protein, ur NEG  NEG mg/dL   Urobilinogen, UA 0.2  0.0 - 1.0 mg/dL   Nitrite NEG  NEG   Leukocytes, UA NEG  NEG  POCT HEMOGLOBIN-HEMACUE     Status: None   Collection Time    12/11/13 11:38 AM      Result Value Range   Hemoglobin 13.7  12.0 - 15.0 g/dL  POCT URINALYSIS DIPSTICK     Status: None   Collection Time    12/25/13  5:01 PM      Result Value Range   Color, UA clear     Clarity, UA clear     Glucose, UA neg     Bilirubin, UA neg     Ketones, UA neg     Spec Grav, UA <=1.005     Blood, UA neg     pH, UA 6.0     Protein, UA neg     Urobilinogen, UA 0.2     Nitrite, UA neg     Leukocytes, UA Negative      Assessment/Plan: No problem-specific assessment & plan notes found for this encounter.

## 2013-12-26 ENCOUNTER — Ambulatory Visit (INDEPENDENT_AMBULATORY_CARE_PROVIDER_SITE_OTHER): Payer: Federal, State, Local not specified - PPO | Admitting: General Surgery

## 2013-12-26 ENCOUNTER — Encounter (INDEPENDENT_AMBULATORY_CARE_PROVIDER_SITE_OTHER): Payer: Self-pay | Admitting: General Surgery

## 2013-12-26 VITALS — BP 102/68 | HR 68 | Resp 16 | Ht 66.75 in | Wt 191.8 lb

## 2013-12-26 DIAGNOSIS — Z09 Encounter for follow-up examination after completed treatment for conditions other than malignant neoplasm: Secondary | ICD-10-CM

## 2013-12-26 NOTE — Progress Notes (Signed)
History: Patient returns for postop check following needle localized right breast lumpectomy with a family history of breast cancer and biopsy showing sclerosing lesion of the right breast. She reports she had some dizziness and issues from anesthesia for a day or 2 and some pain that is all now improved.  Pathology which were reviewed showed a benign fibroadenoma and a marking clip was in the tumor and the specimen  Exam shows a well-healed circumareolar incision without complication.  Assessment and plan: Doing well following needle localized excision without complication. Continue routine screening and return as needed.

## 2014-01-06 IMAGING — CR DG ABDOMEN 1V
1 series · 1 of 1 positions shown · non-contrast
Comparison: CT 06/11/2013

CLINICAL DATA: Bilateral flank pain.

ABDOMEN - 1 VIEW

[t abdomen supine]
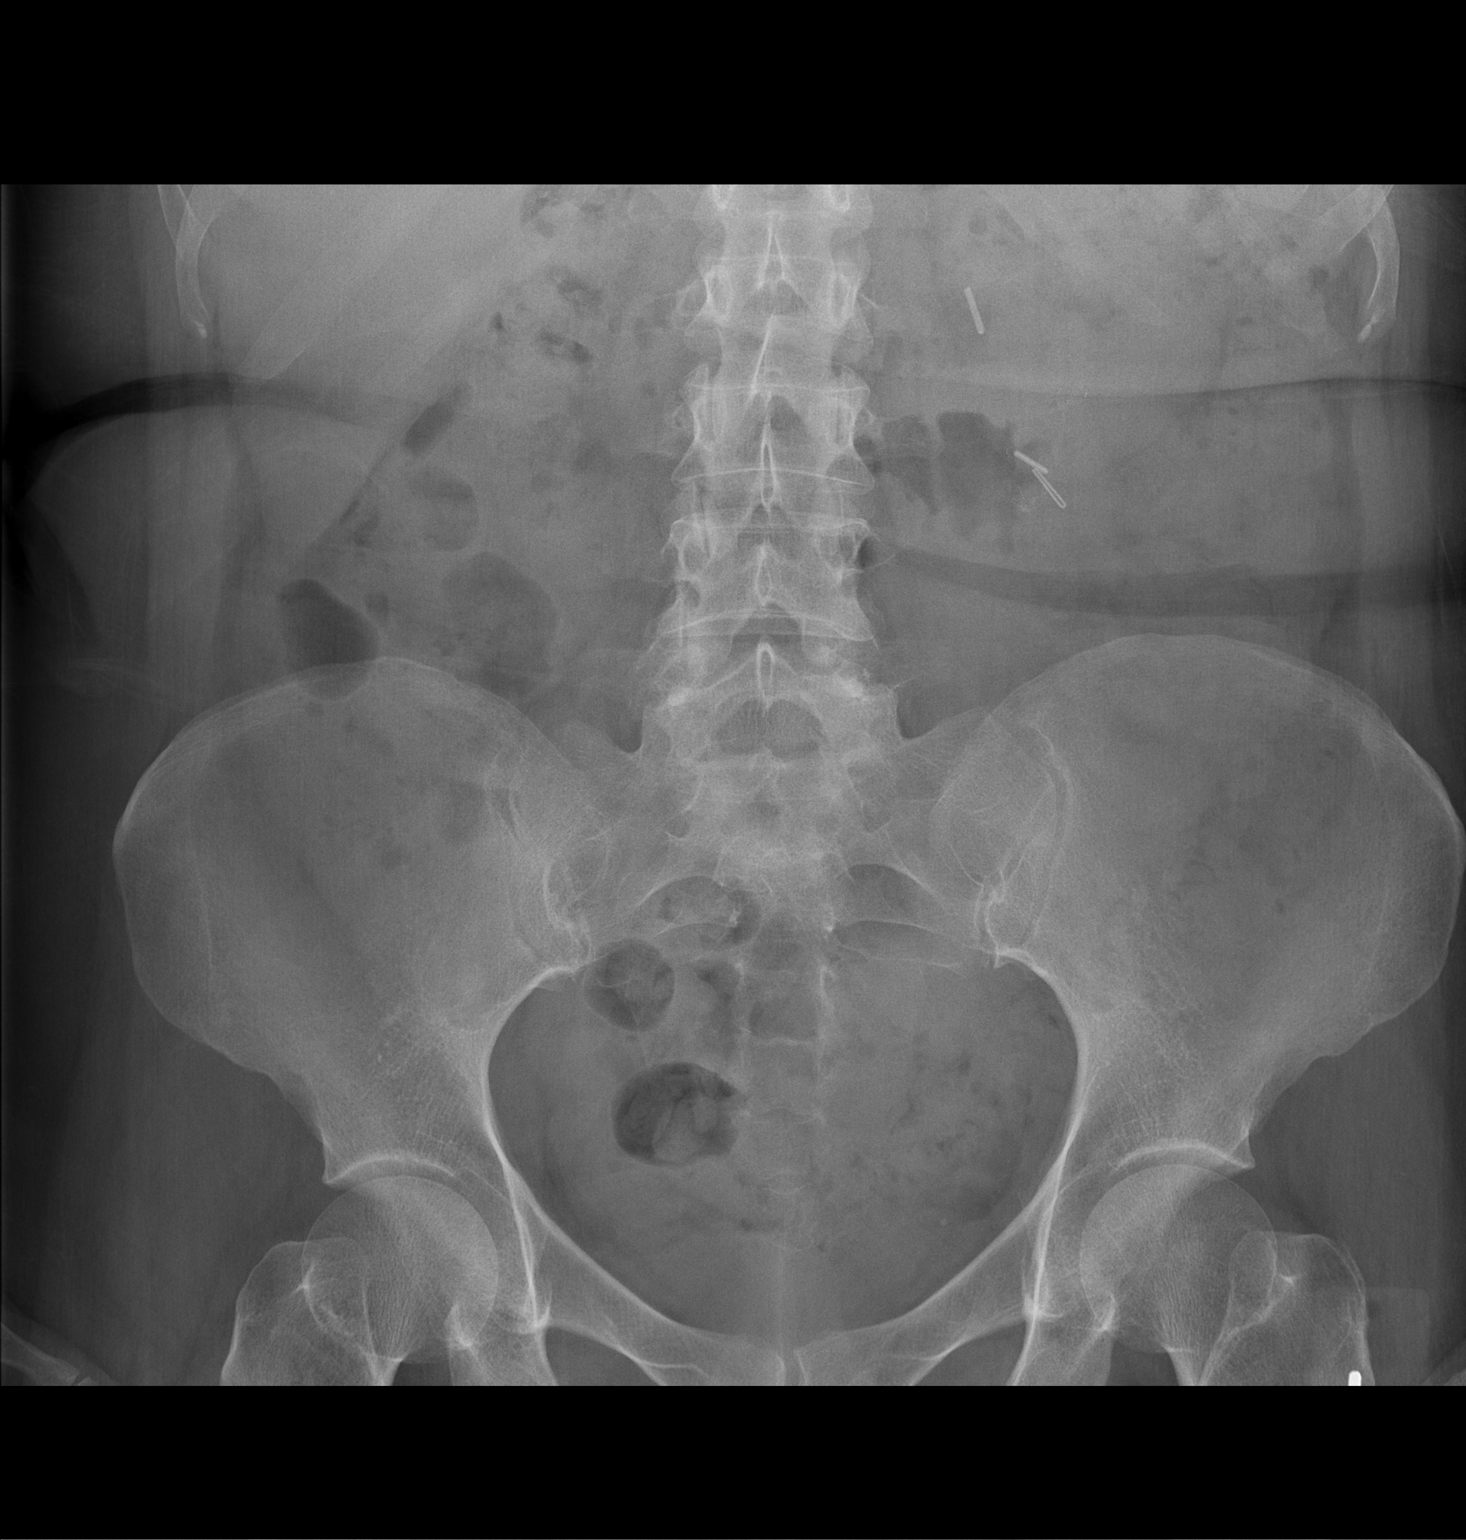

[1 of 1 positions shown; findings below may reference images not displayed]

FINDINGS: Vascular clips in the left upper abdomen.  Normal bowel
gas pattern.  Regional bones unremarkable.
IMPRESSION: Normal bowel gas pattern

## 2014-01-10 ENCOUNTER — Telehealth: Payer: Self-pay | Admitting: Physician Assistant

## 2014-01-10 LAB — CBC WITH DIFFERENTIAL/PLATELET
Basophils Absolute: 0.1 10*3/uL (ref 0.0–0.1)
Basophils Relative: 1 % (ref 0–1)
EOS ABS: 0.5 10*3/uL (ref 0.0–0.7)
EOS PCT: 7 % — AB (ref 0–5)
HEMATOCRIT: 37 % (ref 36.0–46.0)
HEMOGLOBIN: 12.9 g/dL (ref 12.0–15.0)
Lymphocytes Relative: 25 % (ref 12–46)
Lymphs Abs: 1.8 10*3/uL (ref 0.7–4.0)
MCH: 30.1 pg (ref 26.0–34.0)
MCHC: 34.9 g/dL (ref 30.0–36.0)
MCV: 86.2 fL (ref 78.0–100.0)
MONO ABS: 0.6 10*3/uL (ref 0.1–1.0)
MONOS PCT: 8 % (ref 3–12)
Neutro Abs: 4.2 10*3/uL (ref 1.7–7.7)
Neutrophils Relative %: 59 % (ref 43–77)
Platelets: 385 10*3/uL (ref 150–400)
RBC: 4.29 MIL/uL (ref 3.87–5.11)
RDW: 13.8 % (ref 11.5–15.5)
WBC: 7.2 10*3/uL (ref 4.0–10.5)

## 2014-01-10 LAB — LIPID PANEL
CHOLESTEROL: 250 mg/dL — AB (ref 0–200)
HDL: 66 mg/dL (ref >39)
LDL Cholesterol: 142 mg/dL — ABNORMAL HIGH (ref 0–99)
TRIGLYCERIDES: 212 mg/dL — AB (ref <150)
Total CHOL/HDL Ratio: 3.8 Ratio
VLDL: 42 mg/dL — AB (ref 0–40)

## 2014-01-10 NOTE — Telephone Encounter (Signed)
Patient states that she has been taking a probiotic for the past few days and is having a lot less gas. She states that she is feeling better on this. She wants to know if this is okay to take?   Also, she would like to know if it would be okay to take a colon cleanse?

## 2014-01-10 NOTE — Telephone Encounter (Signed)
That is up to the patient. I do not necessarily feel she needs it, but it is her decision.

## 2014-01-10 NOTE — Telephone Encounter (Signed)
Those should both be fine.  Make sure she is not taking the probiotic more than directed, but it is ok to take daily.

## 2014-01-10 NOTE — Telephone Encounter (Signed)
Patient informed, understood & agreed/SLS  Patient would now like to ask provider if he thinks it would be [facility] for her to get a "Colon Irrigation via a Doctor in town who is a Neuropath"/SLS Please Advise.

## 2014-01-10 NOTE — Telephone Encounter (Signed)
Please Advise

## 2014-01-11 LAB — TSH: TSH: 1.835 u[IU]/mL (ref 0.350–4.500)

## 2014-02-05 IMAGING — CT CT PARANASAL SINUSES LIMITED
1 series · 10 of 12 positions shown, 13 images · non-contrast
Comparison: None.

CLINICAL DATA: Congestion and nasal sinus

CT PARANASAL SINUS LIMITED WITHOUT CONTRAST
TECHNIQUE: Multidetector CT images of the paranasal sinuses were
obtained in a single plane without contrast.

[Series 3: coronal soft · axial · 0.35mm/px · z∈[+19,+109]mm · 10 of 12 slices shown, 13 images]
[im 2/12  brain]
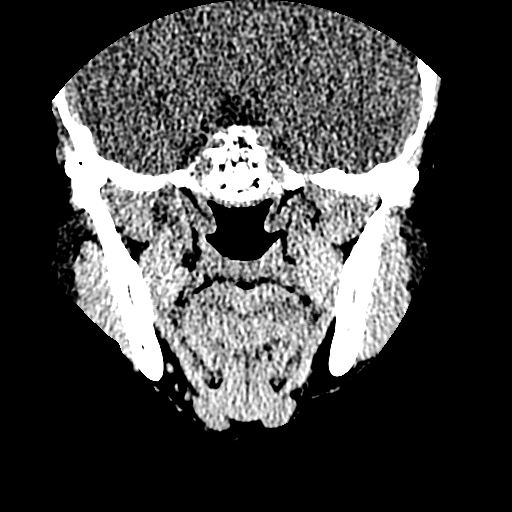
[im 2/12  bone]
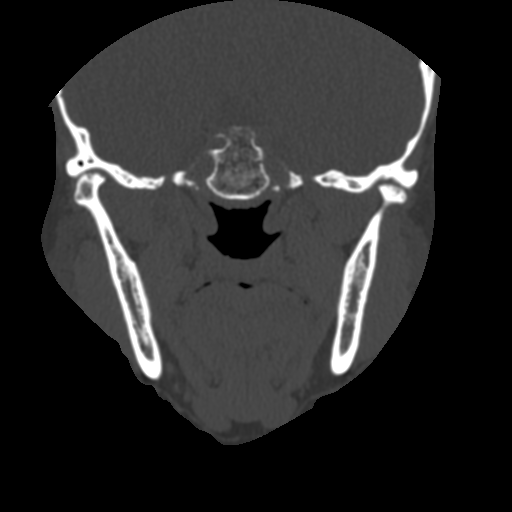
[im 3/12  bone]
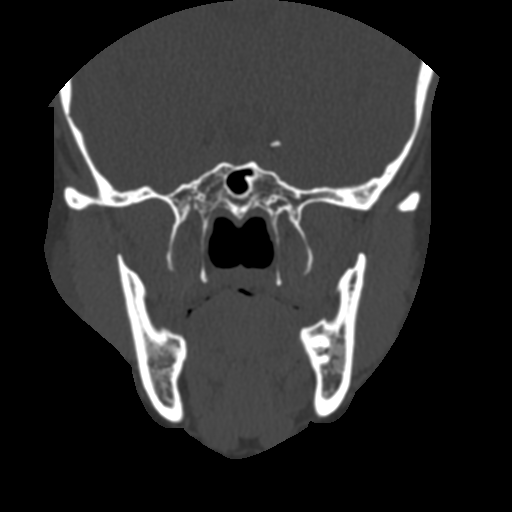
[im 4/12  bone]
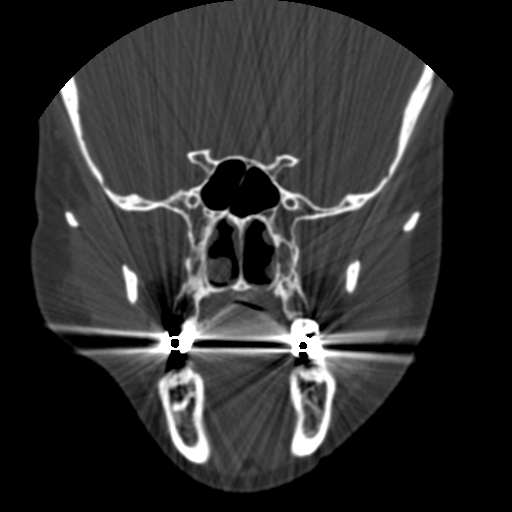
[im 5/12  bone]
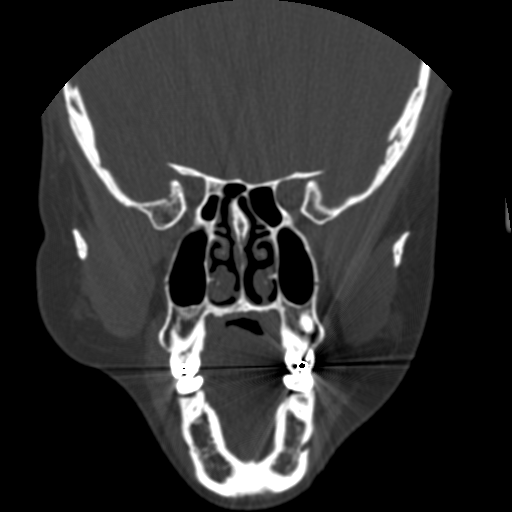
[im 6/12  brain]
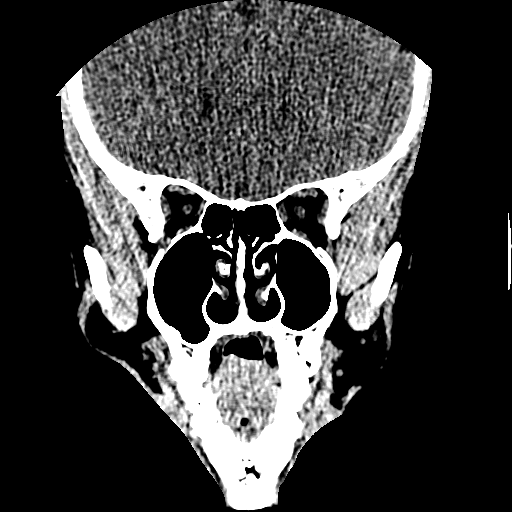
[im 6/12  bone]
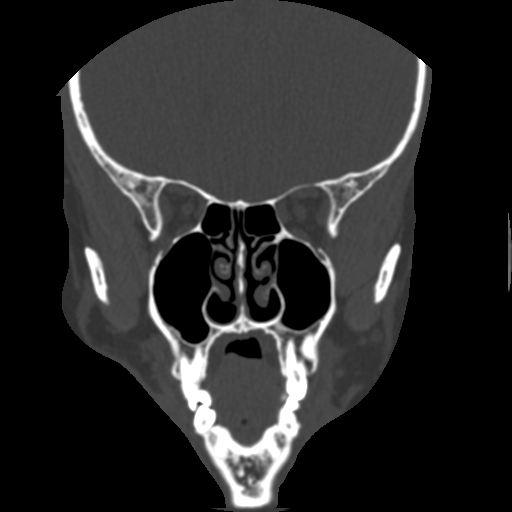
[im 7/12  bone]
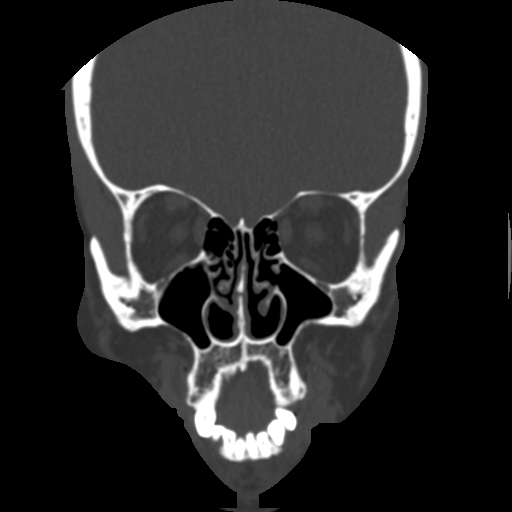
[im 8/12  bone]
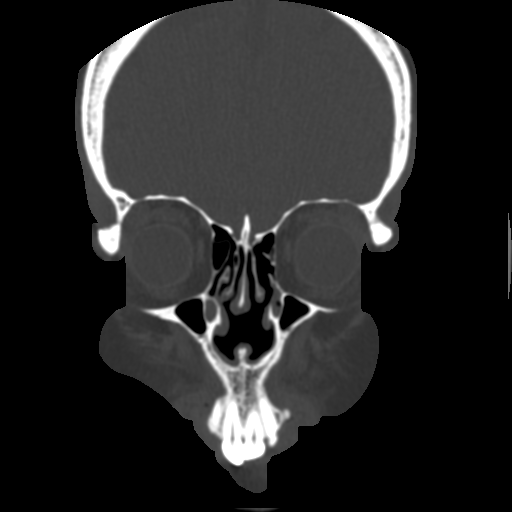
[im 9/12  bone]
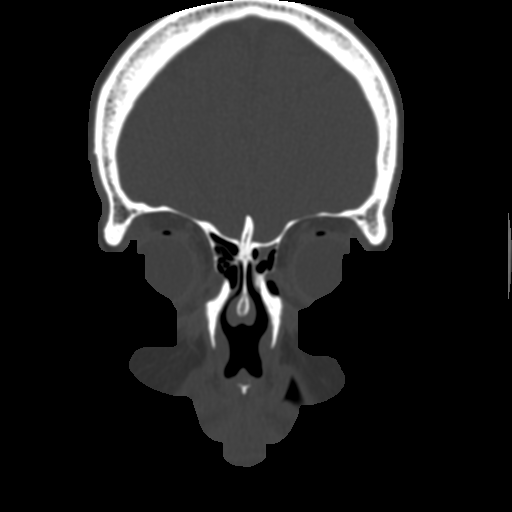
[im 10/12  brain]
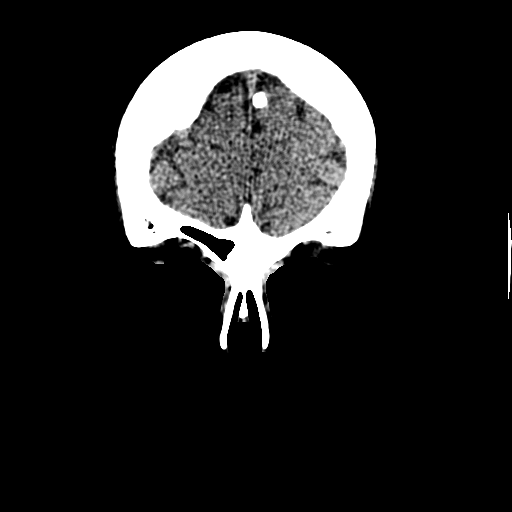
[im 10/12  bone]
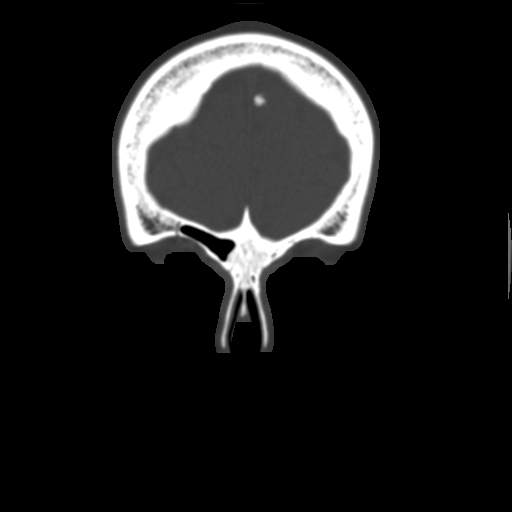
[im 11/12  bone]
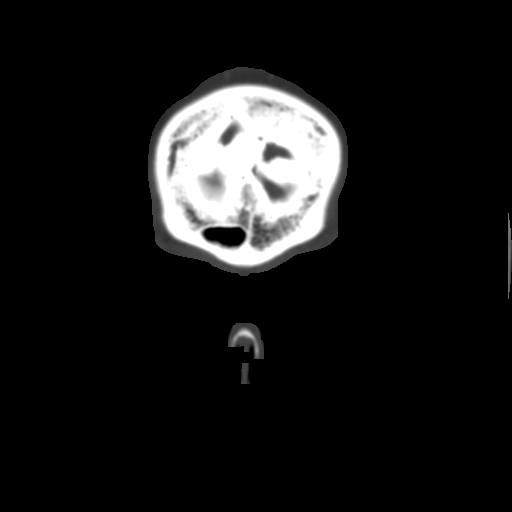

[10 of 12 positions shown; findings below may reference images not displayed]

FINDINGS: Perforation of the nasal septum which appears chronic.
No associated mass lesion in this area.

Apparent surgical changes involving the ostiomeatal complex on the
right.

Mucosal thickening in the right ethmoid sinus.  Right frontal sinus
is hypoplastic.  Left frontal sinus is clear.  The left ethmoid
sinus is clear.  Apparent postsurgical changes involving the right
ethmoid sinus.  Minimal mucosal thickening in the maxillary
sinuses.  No air-fluid level.
IMPRESSION: Postsurgical changes involving the right ethmoid sinus and right
ostiomeatal complex.

Mild mucosal edema in the sinuses.  No air-fluid level.

Perforation of the nasal septum.

## 2014-03-07 ENCOUNTER — Encounter: Payer: Self-pay | Admitting: Physician Assistant

## 2014-03-07 ENCOUNTER — Ambulatory Visit (INDEPENDENT_AMBULATORY_CARE_PROVIDER_SITE_OTHER): Payer: Medicare Other | Admitting: Physician Assistant

## 2014-03-07 VITALS — BP 100/70 | HR 66 | Temp 97.8°F | Ht 67.0 in | Wt 194.0 lb

## 2014-03-07 DIAGNOSIS — R300 Dysuria: Secondary | ICD-10-CM | POA: Insufficient documentation

## 2014-03-07 DIAGNOSIS — J019 Acute sinusitis, unspecified: Secondary | ICD-10-CM | POA: Diagnosis not present

## 2014-03-07 DIAGNOSIS — H9209 Otalgia, unspecified ear: Secondary | ICD-10-CM | POA: Diagnosis not present

## 2014-03-07 DIAGNOSIS — J329 Chronic sinusitis, unspecified: Secondary | ICD-10-CM | POA: Insufficient documentation

## 2014-03-07 MED ORDER — LEVOFLOXACIN 500 MG PO TABS: 500.0000 mg | ORAL_TABLET | Freq: Every day | ORAL | Status: DC

## 2014-03-07 MED ORDER — ANTIPYRINE-BENZOCAINE 5.4-1.4 % OT SOLN: 3.0000 [drp] | OTIC | Status: DC | PRN

## 2014-03-07 NOTE — Progress Notes (Signed)
Patient presents to clinic today c/o sinus pressure, sinus pain , ear pain, fatigue and post-nasal drip x 3 weeks.  Patient has history of recurrent sinusitis.  Has already been referred to ENT due to recurrence of symptoms to assess for a structural cause of recurrence.  Patient continues to defer this appointment.  Patient denies cough, SOB of wheezing.  Does endorse some mild nausea.   Patient also continues to complain of urinary urgency and frequency.  Also with some dysuria.  Workups have always been unremarkable.  Patient has been referred to Urology and has upcoming appointment on 03/11/14.  Past Medical History  Diagnosis Date  . Thyroid disease   . Asthma   . PCOS (polycystic ovarian syndrome)     takes metformin for this  . Migraine   . Anemia   . Arthritis   . Wears glasses     Current Outpatient Prescriptions on File Prior to Visit  Medication Sig Dispense Refill  . albuterol (PROVENTIL HFA;VENTOLIN HFA) 108 (90 BASE) MCG/ACT inhaler Inhale 1-2 puffs into the lungs every 6 (six) hours as needed for wheezing.  1 Inhaler  0  . aspirin-acetaminophen-caffeine (EXCEDRIN MIGRAINE) 638-466-59 MG per tablet Take 1 tablet by mouth every 6 (six) hours as needed. For migraine      . butalbital-aspirin-caffeine-codeine (FIORINAL WITH CODEINE) 50-325-40-30 MG capsule Take 1 capsule by mouth every 4 (four) hours as needed. For migraines  30 capsule  0  . eszopiclone (LUNESTA) 1 MG TABS tablet Take 1 tablet (1 mg total) by mouth at bedtime as needed for sleep. Take immediately before bedtime  30 tablet  1  . HYDROcodone-acetaminophen (NORCO/VICODIN) 5-325 MG per tablet Take 1-2 tablets by mouth every 4 (four) hours as needed.  30 tablet  0  . levothyroxine (SYNTHROID, LEVOTHROID) 150 MCG tablet Take 1 tablet (150 mcg total) by mouth daily.  90 tablet  3  . metFORMIN (GLUCOPHAGE) 500 MG tablet TAKE 1 TABLET BY MOUTH TWICE DAILY  60 tablet  2   No current facility-administered medications on  file prior to visit.    Allergies  Allergen Reactions  . Penicillins Anaphylaxis  . Iodine Other (See Comments)    wheezing  . Sulfa Antibiotics Hives    Family History  Problem Relation Age of Onset  . Cancer Mother   . Hypertension Mother   . Hyperlipidemia Mother   . Hypertension Father   . Cancer Paternal Grandfather     lung    History   Social History  . Marital Status: Single    Spouse Name: N/A    Number of Children: N/A  . Years of Education: N/A   Social History Main Topics  . Smoking status: Former Smoker    Quit date: 10/10/2006  . Smokeless tobacco: None  . Alcohol Use: No  . Drug Use: No  . Sexual Activity: None   Other Topics Concern  . None   Social History Narrative  . None   Review of Systems - See HPI.  All other ROS are negative.  BP 100/70  Pulse 66  Temp(Src) 97.8 F (36.6 C) (Oral)  Ht '5\' 7"'  (1.702 m)  Wt 194 lb (87.998 kg)  BMI 30.38 kg/m2  SpO2 98%  LMP 02/21/2014  Physical Exam  Vitals reviewed. Constitutional: She is oriented to person, place, and time and well-developed, well-nourished, and in no distress.  HENT:  Head: Normocephalic and atraumatic.  Right Ear: External ear normal.  Left Ear: External ear normal.  Nose: Nose normal.  Mouth/Throat: Oropharynx is clear and moist. No oropharyngeal exudate.  TM within normal limits bilaterally.  + TTP of sinuses noted on exam.  Eyes: Conjunctivae are normal. Pupils are equal, round, and reactive to light.  Neck: Neck supple.  Cardiovascular: Normal rate, regular rhythm, normal heart sounds and intact distal pulses.   Pulmonary/Chest: Effort normal and breath sounds normal. No respiratory distress. She has no wheezes. She has no rales. She exhibits no tenderness.  Lymphadenopathy:    She has no cervical adenopathy.  Neurological: She is alert and oriented to person, place, and time.  Skin: Skin is warm and dry. No rash noted.  Psychiatric: Affect normal.   Recent  Results (from the past 2160 hour(s))  POCT HEMOGLOBIN-HEMACUE     Status: None   Collection Time    12/11/13 11:38 AM      Result Value Ref Range   Hemoglobin 13.7  12.0 - 15.0 g/dL  POCT URINALYSIS DIPSTICK     Status: None   Collection Time    12/25/13  5:01 PM      Result Value Ref Range   Color, UA clear     Clarity, UA clear     Glucose, UA neg     Bilirubin, UA neg     Ketones, UA neg     Spec Grav, UA <=1.005     Blood, UA neg     pH, UA 6.0     Protein, UA neg     Urobilinogen, UA 0.2     Nitrite, UA neg     Leukocytes, UA Negative    LIPID PANEL     Status: Abnormal   Collection Time    01/10/14 11:45 AM      Result Value Ref Range   Cholesterol 250 (*) 0 - 200 mg/dL   Comment: ATP III Classification:           < 200        mg/dL        Desirable          200 - 239     mg/dL        Borderline High          >= 240        mg/dL        High         Triglycerides 212 (*) <150 mg/dL   HDL 66  >39 mg/dL   Total CHOL/HDL Ratio 3.8     VLDL 42 (*) 0 - 40 mg/dL   LDL Cholesterol 142 (*) 0 - 99 mg/dL   Comment:       Total Cholesterol/HDL Ratio:CHD Risk                            Coronary Heart Disease Risk Table                                            Men       Women              1/2 Average Risk              3.4        3.3  Average Risk              5.0        4.4               2X Average Risk              9.6        7.1               3X Average Risk             23.4       11.0     Use the calculated Patient Ratio above and the CHD Risk table      to determine the patient's CHD Risk.     ATP III Classification (LDL):           < 100        mg/dL         Optimal          100 - 129     mg/dL         Near or Above Optimal          130 - 159     mg/dL         Borderline High          160 - 189     mg/dL         High           > 190        mg/dL         Very High        CBC WITH DIFFERENTIAL     Status: Abnormal   Collection Time    01/10/14  11:45 AM      Result Value Ref Range   WBC 7.2  4.0 - 10.5 K/uL   RBC 4.29  3.87 - 5.11 MIL/uL   Hemoglobin 12.9  12.0 - 15.0 g/dL   HCT 37.0  36.0 - 46.0 %   MCV 86.2  78.0 - 100.0 fL   MCH 30.1  26.0 - 34.0 pg   MCHC 34.9  30.0 - 36.0 g/dL   RDW 13.8  11.5 - 15.5 %   Platelets 385  150 - 400 K/uL   Neutrophils Relative % 59  43 - 77 %   Neutro Abs 4.2  1.7 - 7.7 K/uL   Lymphocytes Relative 25  12 - 46 %   Lymphs Abs 1.8  0.7 - 4.0 K/uL   Monocytes Relative 8  3 - 12 %   Monocytes Absolute 0.6  0.1 - 1.0 K/uL   Eosinophils Relative 7 (*) 0 - 5 %   Eosinophils Absolute 0.5  0.0 - 0.7 K/uL   Basophils Relative 1  0 - 1 %   Basophils Absolute 0.1  0.0 - 0.1 K/uL   Smear Review Criteria for review not met    TSH     Status: None   Collection Time    01/10/14 11:45 AM      Result Value Ref Range   TSH 1.835  0.350 - 4.500 uIU/mL   Assessment/Plan: Acute sinusitis Rx Levaquin.  Rx Auralgan for otalgia.  Increase fluids.  Rest. Saline nasal spray.  Flonase daily.  Humidifier in bedroom.  Discussed with patient that it is her responsibility to see the ENT as directed.  I will not continue to give her antibiotics for sinusitis if she is not willing to get  the appropriate workup.  Patient voiced understanding.  Burning with urination Urine dip unremarkable except for blood.  Will send for micro to rule/out false positive.  Patient being treated with Levaquin for sinusitis that should cover urinary tract.  Patient to follow-up with Urologist as scheduled.

## 2014-03-07 NOTE — Assessment & Plan Note (Signed)
Rx Levaquin.  Rx Auralgan for otalgia.  Increase fluids.  Rest. Saline nasal spray.  Flonase daily.  Humidifier in bedroom.  Discussed with patient that it is her responsibility to see the ENT as directed.  I will not continue to give her antibiotics for sinusitis if she is not willing to get the appropriate workup.  Patient voiced understanding.

## 2014-03-07 NOTE — Patient Instructions (Signed)
Take antibiotic as directed.  Increase fluids.  Rest.  Use saline nasal spray.  Place a humidifier in the bedroom.  Can use Auralgan as needed for ear pain.    I WILL NOT CONTINUE TO PRESCRIBE ANTIBIOTICS FOR YOUR RECURRENT SINUSITIS.  I HAVE REFERRED YOU TO ENT FOR FURTHER EVALUATION TO ASSESS CAUSE OF RECURRENT SYMPTOMS.  IT IS YOUR RESPONSIBILITY TO SEE THE SPECIALIST. It is Dr. Rushie Chestnut with [facility].  The number is [phone].

## 2014-03-07 NOTE — Assessment & Plan Note (Signed)
Urine dip unremarkable except for blood.  Will send for micro to rule/out false positive.  Patient being treated with Levaquin for sinusitis that should cover urinary tract.  Patient to follow-up with Urologist as scheduled.

## 2014-03-07 NOTE — Addendum Note (Signed)
Addended by: Raiford Noble on: 03/07/2014 02:07 PM   Modules accepted: Orders

## 2014-03-07 NOTE — Progress Notes (Signed)
Pre visit review using our clinic review tool, if applicable. No additional management support is needed unless otherwise documented below in the visit note. 

## 2014-03-08 LAB — URINALYSIS, MICROSCOPIC ONLY
Bacteria, UA: NONE SEEN
Casts: NONE SEEN
Crystals: NONE SEEN

## 2014-03-14 ENCOUNTER — Ambulatory Visit: Payer: Self-pay | Admitting: Physician Assistant

## 2014-03-17 ENCOUNTER — Ambulatory Visit: Payer: Federal, State, Local not specified - PPO | Admitting: Physician Assistant

## 2014-03-19 ENCOUNTER — Ambulatory Visit: Payer: Federal, State, Local not specified - PPO | Admitting: Physician Assistant

## 2014-03-20 ENCOUNTER — Ambulatory Visit: Payer: Federal, State, Local not specified - PPO | Admitting: Physician Assistant

## 2014-03-24 ENCOUNTER — Ambulatory Visit (INDEPENDENT_AMBULATORY_CARE_PROVIDER_SITE_OTHER): Payer: Medicare Other | Admitting: Physician Assistant

## 2014-03-24 ENCOUNTER — Encounter: Payer: Self-pay | Admitting: Physician Assistant

## 2014-03-24 VITALS — BP 95/64 | HR 84 | Temp 98.2°F | Resp 16 | Ht 67.0 in | Wt 194.4 lb

## 2014-03-24 DIAGNOSIS — R062 Wheezing: Secondary | ICD-10-CM

## 2014-03-24 DIAGNOSIS — J329 Chronic sinusitis, unspecified: Secondary | ICD-10-CM | POA: Diagnosis not present

## 2014-03-24 DIAGNOSIS — F319 Bipolar disorder, unspecified: Secondary | ICD-10-CM

## 2014-03-24 DIAGNOSIS — G43909 Migraine, unspecified, not intractable, without status migrainosus: Secondary | ICD-10-CM

## 2014-03-24 DIAGNOSIS — R350 Frequency of micturition: Secondary | ICD-10-CM | POA: Diagnosis not present

## 2014-03-24 DIAGNOSIS — F411 Generalized anxiety disorder: Secondary | ICD-10-CM

## 2014-03-24 DIAGNOSIS — F419 Anxiety disorder, unspecified: Secondary | ICD-10-CM

## 2014-03-24 DIAGNOSIS — R3915 Urgency of urination: Secondary | ICD-10-CM

## 2014-03-24 LAB — POCT URINALYSIS DIPSTICK
Bilirubin, UA: NEGATIVE
GLUCOSE UA: NEGATIVE
Ketones, UA: NEGATIVE
LEUKOCYTES UA: NEGATIVE
NITRITE UA: NEGATIVE
PROTEIN UA: NEGATIVE
Spec Grav, UA: <=1.005
UROBILINOGEN UA: 0.2
pH, UA: 6

## 2014-03-24 MED ORDER — BENZONATATE 100 MG PO CAPS: 100.0000 mg | ORAL_CAPSULE | Freq: Two times a day (BID) | ORAL | Status: DC | PRN

## 2014-03-24 MED ORDER — BUTALBITAL-ASA-CAFF-CODEINE 50-325-40-30 MG PO CAPS: 1.0000 | ORAL_CAPSULE | ORAL | Status: DC | PRN

## 2014-03-24 MED ORDER — ALBUTEROL SULFATE HFA 108 (90 BASE) MCG/ACT IN AERS: 1.0000 | INHALATION_SPRAY | Freq: Four times a day (QID) | RESPIRATORY_TRACT | Status: AC | PRN

## 2014-03-24 NOTE — Patient Instructions (Addendum)
Please follow-up with ENT and Urology this week as scheduled.  You urine is negative for evidence of infection.  There was some hemoglobin noted on the dip, so we are sending your urine to look at under the microscope.  Unfortunately I feel your sinus symptoms are inflammatory in nature.  You have been given multiple rounds of antibiotics and you symptoms always recur.  This is why you need to follow-up with the ENT.  Increase your fluid intake.  Take tessalon Perles if needed for cough.  Continue medications as directed.

## 2014-03-24 NOTE — Progress Notes (Signed)
Patient presents to clinic today c/o continued sinus pressure and nasal congestion, despite treatment with Levaquin. Patient history of multiple, recurrent sinus infections. Question bacterial cause of symptoms. Symptom seems more inflammatory/allergic in nature. Patient requesting additional antibiotic. Denies fever, chills, aches. Patient also requests and we rechecked her urine. Patient has complaints of dysuria at each visit. Urine usually unremarkable for UTI. Patient has been set up with the urologist. Patient has seen urology as of last week. Reviewed urologists notes. Urology however quested/recommended further tests and imaging. Patient denied at that visit.  Patient also needing refill of migraine medication.  Patient also requesting to see another psychiatrist.  Past Medical History  Diagnosis Date  . Thyroid disease   . Asthma   . PCOS (polycystic ovarian syndrome)     takes metformin for this  . Migraine   . Anemia   . Arthritis   . Wears glasses    Current Outpatient Prescriptions on File Prior to Visit  Medication Sig Dispense Refill  . aspirin-acetaminophen-caffeine (EXCEDRIN MIGRAINE) 250-250-65 MG per tablet Take 1 tablet by mouth every 6 (six) hours as needed. For migraine      . eszopiclone (LUNESTA) 1 MG TABS tablet Take 1 tablet (1 mg total) by mouth at bedtime as needed for sleep. Take immediately before bedtime  30 tablet  1  . HYDROcodone-acetaminophen (NORCO/VICODIN) 5-325 MG per tablet Take 1-2 tablets by mouth every 4 (four) hours as needed.  30 tablet  0  . levothyroxine (SYNTHROID, LEVOTHROID) 150 MCG tablet Take 1 tablet (150 mcg total) by mouth daily.  90 tablet  3  . metFORMIN (GLUCOPHAGE) 500 MG tablet TAKE 1 TABLET BY MOUTH TWICE DAILY  60 tablet  2  . antipyrine-benzocaine (AURALGAN) otic solution Place 3-4 drops into both ears every 2 (two) hours as needed for ear pain.  10 mL  0   No current facility-administered medications on file prior to visit.     Allergies  Allergen Reactions  . Penicillins Anaphylaxis  . Azithromycin Itching  . Iodine Other (See Comments)    wheezing  . Sulfa Antibiotics Hives    Family History  Problem Relation Age of Onset  . Cancer Mother   . Hypertension Mother   . Hyperlipidemia Mother   . Hypertension Father   . Cancer Paternal Grandfather     lung    History   Social History  . Marital Status: Single    Spouse Name: N/A    Number of Children: N/A  . Years of Education: N/A   Social History Main Topics  . Smoking status: Former Smoker    Quit date: 10/10/2006  . Smokeless tobacco: None  . Alcohol Use: No  . Drug Use: No  . Sexual Activity: None   Other Topics Concern  . None   Social History Narrative  . None   Review of Systems - See HPI.  All other ROS are negative.  BP 95/64  Pulse 84  Temp(Src) 98.2 F (36.8 C) (Oral)  Resp 16  Ht $R'5\' 7"'bm$  (1.702 m)  Wt 194 lb 6 oz (88.168 kg)  BMI 30.44 kg/m2  SpO2 96%  LMP 02/21/2014  Physical Exam  Vitals reviewed. Constitutional: She is oriented to person, place, and time and well-developed, well-nourished, and in no distress.  HENT:  Head: Normocephalic and atraumatic.  Right Ear: External ear normal.  Left Ear: External ear normal.  Nose: Nose normal.  Mouth/Throat: Oropharynx is clear and moist. No oropharyngeal exudate.  Tympanic  membranes within normal limits bilaterally. No TTP a sinus noted on examination.  Eyes: Conjunctivae are normal. Pupils are equal, round, and reactive to light.  Neck: Neck supple.  Cardiovascular: Normal rate, regular rhythm, normal heart sounds and intact distal pulses.   Pulmonary/Chest: Effort normal and breath sounds normal. No respiratory distress. She has no wheezes. She has no rales. She exhibits no tenderness.  Lymphadenopathy:    She has no cervical adenopathy.  Neurological: She is alert and oriented to person, place, and time. No cranial nerve deficit.  Skin: Skin is warm and  dry. No rash noted.    Recent Results (from the past 2160 hour(s))  POCT URINALYSIS DIPSTICK     Status: None   Collection Time    12/25/13  5:01 PM      Result Value Ref Range   Color, UA clear     Clarity, UA clear     Glucose, UA neg     Bilirubin, UA neg     Ketones, UA neg     Spec Grav, UA <=1.005     Blood, UA neg     pH, UA 6.0     Protein, UA neg     Urobilinogen, UA 0.2     Nitrite, UA neg     Leukocytes, UA Negative    LIPID PANEL     Status: Abnormal   Collection Time    01/10/14 11:45 AM      Result Value Ref Range   Cholesterol 250 (*) 0 - 200 mg/dL   Comment: ATP III Classification:           < 200        mg/dL        Desirable          200 - 239     mg/dL        Borderline High          >= 240        mg/dL        High         Triglycerides 212 (*) <150 mg/dL   HDL 66  >39 mg/dL   Total CHOL/HDL Ratio 3.8     VLDL 42 (*) 0 - 40 mg/dL   LDL Cholesterol 142 (*) 0 - 99 mg/dL   Comment:       Total Cholesterol/HDL Ratio:CHD Risk                            Coronary Heart Disease Risk Table                                            Men       Women              1/2 Average Risk              3.4        3.3                  Average Risk              5.0        4.4               2X Average Risk  9.6        7.1               3X Average Risk             23.4       11.0     Use the calculated Patient Ratio above and the CHD Risk table      to determine the patient's CHD Risk.     ATP III Classification (LDL):           < 100        mg/dL         Optimal          100 - 129     mg/dL         Near or Above Optimal          130 - 159     mg/dL         Borderline High          160 - 189     mg/dL         High           > 190        mg/dL         Very High        CBC WITH DIFFERENTIAL     Status: Abnormal   Collection Time    01/10/14 11:45 AM      Result Value Ref Range   WBC 7.2  4.0 - 10.5 K/uL   RBC 4.29  3.87 - 5.11 MIL/uL   Hemoglobin 12.9   12.0 - 15.0 g/dL   HCT 37.0  36.0 - 46.0 %   MCV 86.2  78.0 - 100.0 fL   MCH 30.1  26.0 - 34.0 pg   MCHC 34.9  30.0 - 36.0 g/dL   RDW 13.8  11.5 - 15.5 %   Platelets 385  150 - 400 K/uL   Neutrophils Relative % 59  43 - 77 %   Neutro Abs 4.2  1.7 - 7.7 K/uL   Lymphocytes Relative 25  12 - 46 %   Lymphs Abs 1.8  0.7 - 4.0 K/uL   Monocytes Relative 8  3 - 12 %   Monocytes Absolute 0.6  0.1 - 1.0 K/uL   Eosinophils Relative 7 (*) 0 - 5 %   Eosinophils Absolute 0.5  0.0 - 0.7 K/uL   Basophils Relative 1  0 - 1 %   Basophils Absolute 0.1  0.0 - 0.1 K/uL   Smear Review Criteria for review not met    TSH     Status: None   Collection Time    01/10/14 11:45 AM      Result Value Ref Range   TSH 1.835  0.350 - 4.500 uIU/mL  URINALYSIS, MICROSCOPIC ONLY     Status: Abnormal   Collection Time    03/07/14  2:07 PM      Result Value Ref Range   Squamous Epithelial / LPF FEW  RARE   Crystals NONE SEEN  NONE SEEN   Casts NONE SEEN  NONE SEEN   WBC, UA 0-2  <3 WBC/hpf   RBC / HPF 7-10 (*) <3 RBC/hpf   Bacteria, UA NONE SEEN  RARE  POCT URINALYSIS DIPSTICK     Status: None   Collection Time    03/24/14  4:26 PM      Result Value Ref Range   Color, UA  straw     Comment: Pt sees Urology tomorrow   Clarity, UA sediment     Glucose, UA neg     Bilirubin, UA neg     Ketones, UA neg     Spec Grav, UA <=1.005     Blood, UA moderate     pH, UA 6.0     Protein, UA neg     Urobilinogen, UA 0.2     Nitrite, UA neg     Leukocytes, UA Negative     Assessment/Plan: Migraine Medications refill.  Chronic sinusitis As discussed previously with patient, she needs to be evaluated by ENT. I have previously placed a referral. She endorses having an appointment this week. Symptoms seem inflammatory in nature. I have discussed antibiotic resistance with the patient multiple times. I will not give her an additional antibiotic. She will have to be compliant with our recommendations and see specialist  for further evaluation. Agree to Rx Tessalon for cough.  Anxiety I question some bipolar disorder.  Patient exhibits traits of personality disorders as well. As discussed before do not think Xanax as the best treatment for her symptoms. Patient was referred to a. Will set patient up with a mother neuropsychiatrist by her previous psychiatrist, but did not go to that appointment.  Patient did not do well with her previous psychiatrist. Is requesting referral to a new psychiatrist. Referral placed. Educated patient that I will no longer be doing further referrals if she chooses to cancel her appointment with specialist.  Urinary urgency Urine dip obtained. Positive for hemoglobin. Negative for nitrites or leukocytes. Will send for microscopic review. Patient has already been set up with the specialist, but has been noncompliant with their suggestions. Patient has followup appointment with specialist this week. Advised patient to follow their recommendations.

## 2014-03-24 NOTE — Progress Notes (Signed)
Pre visit review using our clinic review tool, if applicable. No additional management support is needed unless otherwise documented below in the visit note/SLS  

## 2014-03-25 ENCOUNTER — Telehealth: Payer: Self-pay | Admitting: *Deleted

## 2014-03-25 DIAGNOSIS — R059 Cough, unspecified: Secondary | ICD-10-CM

## 2014-03-25 DIAGNOSIS — R05 Cough: Secondary | ICD-10-CM

## 2014-03-25 MED ORDER — PROMETHAZINE-DM 6.25-15 MG/5ML PO SYRP: 5.0000 mL | ORAL_SOLUTION | Freq: Four times a day (QID) | ORAL | Status: DC | PRN

## 2014-03-25 NOTE — Assessment & Plan Note (Signed)
I question some bipolar disorder.  Patient exhibits traits of personality disorders as well. As discussed before do not think Xanax as the best treatment for her symptoms. Patient was referred to a. Will set patient up with a mother neuropsychiatrist by her previous psychiatrist, but did not go to that appointment.  Patient did not do well with her previous psychiatrist. Is requesting referral to a new psychiatrist. Referral placed. Educated patient that I will no longer be doing further referrals if she chooses to cancel her appointment with specialist.

## 2014-03-25 NOTE — Assessment & Plan Note (Signed)
Urine dip obtained. Positive for hemoglobin. Negative for nitrites or leukocytes. Will send for microscopic review. Patient has already been set up with the specialist, but has been noncompliant with their suggestions. Patient has followup appointment with specialist this week. Advised patient to follow their recommendations.

## 2014-03-25 NOTE — Assessment & Plan Note (Signed)
Medications refill

## 2014-03-25 NOTE — Assessment & Plan Note (Addendum)
As discussed previously with patient, she needs to be evaluated by ENT. I have previously placed a referral. She endorses having an appointment this week. Symptoms seem inflammatory in nature. I have discussed antibiotic resistance with the patient multiple times. I will not give her an additional antibiotic. She will have to be compliant with our recommendations and see specialist for further evaluation. Agree to Rx Tessalon for cough.

## 2014-03-25 NOTE — Telephone Encounter (Signed)
Received fax from [facility] that benzonatate 100mg  is on backorder.  Please advise if there is another alternative?

## 2014-03-25 NOTE — Telephone Encounter (Signed)
Left message for pt to return my call.

## 2014-03-25 NOTE — Telephone Encounter (Signed)
Sent in promethazine-dm to patient's pharmacy to take as directed.

## 2014-04-08 ENCOUNTER — Encounter: Payer: Self-pay | Admitting: Physician Assistant

## 2014-04-28 ENCOUNTER — Encounter: Payer: Self-pay | Admitting: Physician Assistant

## 2014-04-28 ENCOUNTER — Telehealth: Payer: Self-pay | Admitting: Physician Assistant

## 2014-04-28 ENCOUNTER — Ambulatory Visit (INDEPENDENT_AMBULATORY_CARE_PROVIDER_SITE_OTHER): Payer: Medicare Other | Admitting: Physician Assistant

## 2014-04-28 VITALS — BP 95/67 | HR 68 | Temp 97.9°F | Resp 16 | Ht 67.0 in | Wt 193.5 lb

## 2014-04-28 DIAGNOSIS — R5383 Other fatigue: Secondary | ICD-10-CM | POA: Diagnosis not present

## 2014-04-28 DIAGNOSIS — N6452 Nipple discharge: Secondary | ICD-10-CM

## 2014-04-28 DIAGNOSIS — G4700 Insomnia, unspecified: Secondary | ICD-10-CM

## 2014-04-28 DIAGNOSIS — M545 Low back pain, unspecified: Secondary | ICD-10-CM | POA: Insufficient documentation

## 2014-04-28 DIAGNOSIS — R5381 Other malaise: Secondary | ICD-10-CM | POA: Diagnosis not present

## 2014-04-28 DIAGNOSIS — N6459 Other signs and symptoms in breast: Secondary | ICD-10-CM | POA: Diagnosis not present

## 2014-04-28 LAB — COMPREHENSIVE METABOLIC PANEL
ALBUMIN: 3.8 g/dL (ref 3.5–5.2)
ALT: 15 U/L (ref 0–35)
AST: 16 U/L (ref 0–37)
Alkaline Phosphatase: 44 U/L (ref 39–117)
BUN: 16 mg/dL (ref 6–23)
CHLORIDE: 107 meq/L (ref 96–112)
CO2: 25 meq/L (ref 19–32)
Calcium: 9.1 mg/dL (ref 8.4–10.5)
Creatinine, Ser: 0.7 mg/dL (ref 0.4–1.2)
GFR: 106.58 mL/min (ref >60.00)
Glucose, Bld: 71 mg/dL (ref 70–99)
POTASSIUM: 3.9 meq/L (ref 3.5–5.1)
SODIUM: 138 meq/L (ref 135–145)
TOTAL PROTEIN: 6.6 g/dL (ref 6.0–8.3)
Total Bilirubin: 0.3 mg/dL (ref 0.2–1.2)

## 2014-04-28 LAB — CBC
HEMATOCRIT: 38.3 % (ref 36.0–46.0)
Hemoglobin: 12.6 g/dL (ref 12.0–15.0)
MCHC: 33 g/dL (ref 30.0–36.0)
MCV: 88.5 fl (ref 78.0–100.0)
Platelets: 357 10*3/uL (ref 150.0–400.0)
RBC: 4.33 Mil/uL (ref 3.87–5.11)
RDW: 13.6 % (ref 11.5–15.5)
WBC: 7.3 10*3/uL (ref 4.0–10.5)

## 2014-04-28 LAB — T4: T4, Total: 7.9 ug/dL (ref 5.0–12.5)

## 2014-04-28 LAB — TSH: TSH: 1.57 u[IU]/mL (ref 0.35–4.50)

## 2014-04-28 MED ORDER — HYDROCODONE-ACETAMINOPHEN 5-325 MG PO TABS: 1.0000 | ORAL_TABLET | ORAL | Status: DC | PRN

## 2014-04-28 MED ORDER — METHYLPREDNISOLONE ACETATE 80 MG/ML IJ SUSP
80.0000 mg | Freq: Once | INTRAMUSCULAR | Status: AC
  Administered 2014-04-28: 80 mg via INTRAMUSCULAR

## 2014-04-28 MED ORDER — CYCLOBENZAPRINE HCL 10 MG PO TABS: ORAL_TABLET | ORAL | Status: AC

## 2014-04-28 MED ORDER — ESZOPICLONE 1 MG PO TABS: 1.0000 mg | ORAL_TABLET | Freq: Every evening | ORAL | Status: DC | PRN

## 2014-04-28 NOTE — Progress Notes (Signed)
Patient presents to clinic today with multiple complaints.  Patient c/o right-sided low back pain over the past 5 days. Denies trauma or injury.  Endorses occasional radiation from back into her RLE.  Denies numbness, tingling or weakness of RLE.  Patient denies saddle anesthesia, change to urination or defecation.   Patient also complains of fatigue noted over the past month.  Patient denies recent illness.  Denies SOB, palpitations.  Endorses well-balanced diet. Denies exercise.  Denies change in stressors.  Patient with baseline anxiety.  Patient is not following up with specialist.   Patient complains of pain in her right breast over the past week.  Notes milky discharge from the right nipple.  Last noting of this was 4 days ago. Endorses inversion of the nipple.  Patient had screening mammogram in December which revealed mass of right breast.  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 Patient with family history of breast cancer.  Past Medical History  Diagnosis Date  . Thyroid disease   . Asthma   . PCOS (polycystic ovarian syndrome)     takes metformin for this  . Migraine   . Anemia   . Arthritis   . Wears glasses     Current Outpatient Prescriptions on File Prior to Visit  Medication Sig Dispense Refill  . albuterol (PROVENTIL HFA;VENTOLIN HFA) 108 (90 BASE) MCG/ACT inhaler Inhale 1-2 puffs into the lungs every 6 (six) hours as needed for wheezing.  1 Inhaler  0  . antipyrine-benzocaine (AURALGAN) otic solution Place 3-4 drops into both ears every 2 (two) hours as needed for ear pain.  10 mL  0  . aspirin-acetaminophen-caffeine (EXCEDRIN MIGRAINE) 924-462-86 MG per tablet Take 1 tablet by mouth every 6 (six) hours as needed. For migraine      . butalbital-aspirin-caffeine-codeine (FIORINAL WITH CODEINE) 50-325-40-30 MG capsule Take 1 capsule by mouth every 4 (four) hours as needed. For migraines  30 capsule  0  . levothyroxine (SYNTHROID,  LEVOTHROID) 150 MCG tablet Take 1 tablet (150 mcg total) by mouth daily.  90 tablet  3  . metFORMIN (GLUCOPHAGE) 500 MG tablet TAKE 1 TABLET BY MOUTH TWICE DAILY  60 tablet  2  . Probiotic Product (PROBIOTIC DAILY) CAPS Take by mouth daily.       No current facility-administered medications on file prior to visit.    Allergies  Allergen Reactions  . Penicillins Anaphylaxis  . Azithromycin Itching  . Iodine Other (See Comments)    wheezing  . Sulfa Antibiotics Hives    Family History  Problem Relation Age of Onset  . Cancer Mother   . Hypertension Mother   . Hyperlipidemia Mother   . Hypertension Father   . Cancer Paternal Grandfather     lung    History   Social History  . Marital Status: Single    Spouse Name: N/A    Number of Children: N/A  . Years of Education: N/A   Social History Main Topics  . Smoking status: Former Smoker    Quit date: 10/10/2006  . Smokeless tobacco: None  . Alcohol Use: No  . Drug Use: No  . Sexual Activity: None   Other Topics Concern  . None   Social History Narrative  . None   Review of Systems - See HPI.  All other ROS are negative.  BP 95/67  Pulse 68  Temp(Src) 97.9 F (36.6 C) (Oral)  Resp 16  Ht '5\' 7"'  (1.702 m)  Wt  193 lb 8 oz (87.771 kg)  BMI 30.30 kg/m2  SpO2 96%  LMP 04/21/2014  Physical Exam  Vitals reviewed. Constitutional: She is oriented to person, place, and time and well-developed, well-nourished, and in no distress.  HENT:  Head: Normocephalic and atraumatic.  Eyes: Conjunctivae are normal. Pupils are equal, round, and reactive to light.  Neck: Neck supple.  Cardiovascular: Normal rate, regular rhythm, normal heart sounds and intact distal pulses.   Pulmonary/Chest: Effort normal and breath sounds normal. No respiratory distress. She has no wheezes. She has no rales. She exhibits no tenderness. Right breast exhibits tenderness. Right breast exhibits no inverted nipple, no mass, no nipple discharge and no  skin change. Left breast exhibits no inverted nipple, no mass, no nipple discharge, no skin change and no tenderness.  Musculoskeletal:       Cervical back: Normal.       Thoracic back: Normal.       Lumbar back: She exhibits tenderness, pain and spasm. She exhibits no bony tenderness.  Neurological: She is alert and oriented to person, place, and time.  Skin: Skin is warm and dry. No rash noted.  Psychiatric:  Anxious affect.    Recent Results (from the past 2160 hour(s))  URINALYSIS, MICROSCOPIC ONLY     Status: Abnormal   Collection Time    03/07/14  2:07 PM      Result Value Ref Range   Squamous Epithelial / LPF FEW  RARE   Crystals NONE SEEN  NONE SEEN   Casts NONE SEEN  NONE SEEN   WBC, UA 0-2  <3 WBC/hpf   RBC / HPF 7-10 (*) <3 RBC/hpf   Bacteria, UA NONE SEEN  RARE  POCT URINALYSIS DIPSTICK     Status: None   Collection Time    03/24/14  4:26 PM      Result Value Ref Range   Color, UA straw     Comment: Pt sees Urology tomorrow   Clarity, UA sediment     Glucose, UA neg     Bilirubin, UA neg     Ketones, UA neg     Spec Grav, UA <=1.005     Blood, UA moderate     pH, UA 6.0     Protein, UA neg     Urobilinogen, UA 0.2     Nitrite, UA neg     Leukocytes, UA Negative     Assessment/Plan: Low back pain With radiation.  Im Depo Medrol given by nursing.  Norco refilled.  Rx Flexeril.  Take mediations as directed. Rest.  Avoid heavy lifting or overexertion.  Topical heating pad in 15-minute intervals.  Topical Icy Hot.  Fatigue Likely Multifactorial. Will obtain labs to include TSH and ESR.  Continue well-balanced diet.  Increase aerobic activity.  Follow-up with specialist.  Nipple discharge No nipple inversion, skin changes or nipple discharge noted on examination.  Will obtain US of right breast and bilateral diagnostic mammogram. Will obtain labs to include TSH and Prolactin level.  If nipple discharge recurs, patient to return to clinic so culture can be  obtained.

## 2014-04-28 NOTE — Patient Instructions (Signed)
For back pain -- Please take Norco as directed for pain.  Take Flexeril at bedtime.  You can take another tablet during the day if you will be staying at home. Avoid heavy lifting or overexertion. Apply a heating pad to your lower back in 15-minute intervals. Apply topical Icy Hot.  The steroid shot you were given should ease the pain.  Follow-up in 1-2 weeks if symptoms are not improving.  For Breast -- you will be contacted for an Ultrasound and mammogram.  I am also checking labs to r/o causes of nipple discharge.  Avoid caffeineated beverages.  Continue wearing a supportive bra.  For Fatigue -- I am checking a few labs to assess for causes of your fatigue.  Continue good fluid intake and a well-balanced diet.

## 2014-04-28 NOTE — Telephone Encounter (Signed)
Letter written and can be picked up at [facility] office on Tuesday morning.

## 2014-04-28 NOTE — Telephone Encounter (Signed)
Please Advise

## 2014-04-28 NOTE — Telephone Encounter (Signed)
Caller name: Malaysha Arlen Relation to pt: patient  Call back number: [phone] Pharmacy:  Reason for call: patient called stating that she needs a letter for school stating why she couldn't get her work done due to her being tired and lower back pain. Please advise.

## 2014-04-28 NOTE — Assessment & Plan Note (Signed)
Likely Multifactorial. Will obtain labs to include TSH and ESR.  Continue well-balanced diet.  Increase aerobic activity.  Follow-up with specialist.

## 2014-04-28 NOTE — Assessment & Plan Note (Signed)
With radiation.  Im Depo Medrol given by nursing.  Norco refilled.  Rx Flexeril.  Take mediations as directed. Rest.  Avoid heavy lifting or overexertion.  Topical heating pad in 15-minute intervals.  Topical Icy Hot.

## 2014-04-28 NOTE — Assessment & Plan Note (Signed)
No nipple inversion, skin changes or nipple discharge noted on examination.  Will obtain US of right breast and bilateral diagnostic mammogram. Will obtain labs to include TSH and Prolactin level.  If nipple discharge recurs, patient to return to clinic so culture can be obtained.

## 2014-04-28 NOTE — Progress Notes (Signed)
Pre visit review using our clinic review tool, if applicable. No additional management support is needed unless otherwise documented below in the visit note/SLS  

## 2014-04-28 NOTE — Telephone Encounter (Signed)
Patient informed, understood & agreed/SLS  

## 2014-04-29 LAB — PROLACTIN: Prolactin: 12.1 ng/mL

## 2014-05-01 ENCOUNTER — Other Ambulatory Visit: Payer: Self-pay | Admitting: Physician Assistant

## 2014-05-01 DIAGNOSIS — N6452 Nipple discharge: Secondary | ICD-10-CM

## 2014-05-05 ENCOUNTER — Telehealth: Payer: Self-pay | Admitting: *Deleted

## 2014-05-05 DIAGNOSIS — N6452 Nipple discharge: Secondary | ICD-10-CM

## 2014-05-05 NOTE — Telephone Encounter (Signed)
Message copied by Rockwell Germany on Mon May 05, 2014  6:02 PM ------      Message from: Raiford Noble      Created: Tue Apr 29, 2014  8:22 AM       All labs are normal.  No sign of abnormal thyroid function or excess prolactin to cause her symptoms.  We will know more once she has had the breast US and mammogram. ------

## 2014-05-05 NOTE — Telephone Encounter (Signed)
Patient informed, understood & agreed; reports she has not heard from anyone RE: her Breast US and/or Mammogram, informed pt will have [facility] check on this/SLS  Anderson Malta, could you please check on this & call patient with status. Thanks,  Ivin Booty

## 2014-05-06 NOTE — Telephone Encounter (Signed)
Awaiting for MD to sign off on order, per [facility]

## 2014-05-07 ENCOUNTER — Telehealth: Payer: Self-pay | Admitting: Physician Assistant

## 2014-05-07 ENCOUNTER — Other Ambulatory Visit: Payer: Self-pay | Admitting: Physician Assistant

## 2014-05-07 DIAGNOSIS — N6452 Nipple discharge: Secondary | ICD-10-CM

## 2014-05-07 NOTE — Addendum Note (Signed)
Addended by: Raiford Noble on: 05/07/2014 10:43 AM   Modules accepted: Orders

## 2014-05-07 NOTE — Telephone Encounter (Signed)
Ok did you delete the prior orders?

## 2014-05-07 NOTE — Telephone Encounter (Signed)
Please Advise

## 2014-05-07 NOTE — Telephone Encounter (Signed)
Mammogram and [place] have been schedule per the [facility] for the 26th.  Call and confirm with patient.

## 2014-05-07 NOTE — Telephone Encounter (Addendum)
Note closed in error. 

## 2014-05-07 NOTE — Telephone Encounter (Signed)
I just figured out how to delete the previous orders so I have done that.  Also when I replaced orders earlier, for some reason it did not automatically release them.  The orders are released now. Let me know if you do not see them.

## 2014-05-07 NOTE — Telephone Encounter (Addendum)
I had ordered the ultrasound and mammogram on 6/11.  There is no order pending for me to cosign.  I have put the orders in again -- This needs to be scheduled as soon as possible.  Call me at home (cell) if there are any stumbling blocks.

## 2014-05-08 NOTE — Telephone Encounter (Signed)
Mammogram & [place] scheduled for Thurs, 06.25.15 at 9:15 & 9:40am; pt informed, understood & agreed/SLS

## 2014-05-15 ENCOUNTER — Other Ambulatory Visit: Payer: Self-pay

## 2014-05-28 ENCOUNTER — Telehealth: Payer: Self-pay | Admitting: Physician Assistant

## 2014-05-28 NOTE — Telephone Encounter (Signed)
Received PA for lunesta 1mg , forward to nurse

## 2014-05-28 NOTE — Telephone Encounter (Signed)
Spoke with Sam at [employer] to gather information as to why PA was needed for medication, as not stated on generic PA Form forwarded to clinic; he stated that he spoke with our front office staff on 07.07.15 inquiring as to why this had not been completed, as he had already previously faxed over two request for this PA on pt's Lunesta and verified the fax number at our office and was asked once again to refax request; he then asked our staff to take down information on paper, as his fax were not received and/or forwarded to clinic & voiced his concerns about how this matter was handled. Provider was informed as this was expressed to pharmacy representative that it would be. I apologized for this issue & informed him that provider & I had just returned to clinic this morning since the Holiday on Friday of last week & took the information needed from him to call to obtain Approval for pt's medication. Called [phone] and was given Approval valid thru May 29, 2015 with Insurance coverage for #90 in a year period, then PA would be required again if limit is exceeded. Called Sam back at pharmacy and gave him this information to forward to patient; also LMOM to inform patient & apologize for the delay/SLS

## 2014-05-30 ENCOUNTER — Ambulatory Visit
Admission: RE | Admit: 2014-05-30 | Discharge: 2014-05-30 | Disposition: A | Discharge status: Home / Self Care | Payer: Federal, State, Local not specified - PPO | Source: Ambulatory Visit | Attending: Physician Assistant | Admitting: Physician Assistant

## 2014-05-30 DIAGNOSIS — N6452 Nipple discharge: Secondary | ICD-10-CM

## 2014-07-02 ENCOUNTER — Ambulatory Visit: Payer: Self-pay | Admitting: Physician Assistant

## 2014-07-02 DIAGNOSIS — Z0289 Encounter for other administrative examinations: Secondary | ICD-10-CM

## 2014-07-10 ENCOUNTER — Encounter: Payer: Self-pay | Admitting: Physician Assistant

## 2014-07-10 ENCOUNTER — Ambulatory Visit (INDEPENDENT_AMBULATORY_CARE_PROVIDER_SITE_OTHER): Payer: Medicare Other | Admitting: Physician Assistant

## 2014-07-10 VITALS — BP 96/66 | HR 76 | Temp 98.0°F | Resp 16 | Ht 67.0 in | Wt 196.5 lb

## 2014-07-10 DIAGNOSIS — H60399 Other infective otitis externa, unspecified ear: Secondary | ICD-10-CM | POA: Diagnosis not present

## 2014-07-10 DIAGNOSIS — E282 Polycystic ovarian syndrome: Secondary | ICD-10-CM

## 2014-07-10 DIAGNOSIS — G4700 Insomnia, unspecified: Secondary | ICD-10-CM

## 2014-07-10 DIAGNOSIS — H60393 Other infective otitis externa, bilateral: Secondary | ICD-10-CM

## 2014-07-10 DIAGNOSIS — G43009 Migraine without aura, not intractable, without status migrainosus: Secondary | ICD-10-CM | POA: Diagnosis not present

## 2014-07-10 DIAGNOSIS — E039 Hypothyroidism, unspecified: Secondary | ICD-10-CM

## 2014-07-10 MED ORDER — BUTALBITAL-ASA-CAFF-CODEINE 50-325-40-30 MG PO CAPS: 1.0000 | ORAL_CAPSULE | ORAL | Status: AC | PRN

## 2014-07-10 MED ORDER — KETOROLAC TROMETHAMINE 30 MG/ML IJ SOLN
30.0000 mg | Freq: Once | INTRAMUSCULAR | Status: AC
  Administered 2014-07-10: 30 mg via INTRAMUSCULAR

## 2014-07-10 MED ORDER — HYDROCORTISONE-ACETIC ACID 1-2 % OT SOLN: 3.0000 [drp] | Freq: Three times a day (TID) | OTIC | Status: AC

## 2014-07-10 MED ORDER — METFORMIN HCL 500 MG PO TABS: ORAL_TABLET | ORAL | Status: AC

## 2014-07-10 MED ORDER — ESZOPICLONE 3 MG PO TABS: 3.0000 mg | ORAL_TABLET | Freq: Every evening | ORAL | Status: AC | PRN

## 2014-07-10 MED ORDER — HYDROCODONE-ACETAMINOPHEN 5-325 MG PO TABS
1.0000 | ORAL_TABLET | ORAL | Status: AC | PRN
Start: 2014-07-10 — End: ?

## 2014-07-10 NOTE — Progress Notes (Signed)
Patient presents to clinic today for medication management.  Patient endorses doing well.  Is continuing to follow-up with [facility] for recurrent sinusitis.  Patient needing refills of her Norco for chronic low back pain.  Only takes PRN.  Is overdue for refill.  Also needing refill of Synthroid.  Last TSH within normal limits.  Endorses still having migraines, but very rare.  Good relief with Fiorinal.  Endorses 1 mg of Lunesta is no longer helping with Insomnia.  Past Medical History  Diagnosis Date  . Thyroid disease   . Asthma   . PCOS (polycystic ovarian syndrome)     takes metformin for this  . Migraine   . Anemia   . Arthritis   . Wears glasses     Current Outpatient Prescriptions on File Prior to Visit  Medication Sig Dispense Refill  . albuterol (PROVENTIL HFA;VENTOLIN HFA) 108 (90 BASE) MCG/ACT inhaler Inhale 1-2 puffs into the lungs every 6 (six) hours as needed for wheezing.  1 Inhaler  0  . cyclobenzaprine (FLEXERIL) 10 MG tablet One half tab PO qHS, then increase gradually to one tab TID.  30 tablet  0  . levothyroxine (SYNTHROID, LEVOTHROID) 150 MCG tablet Take 1 tablet (150 mcg total) by mouth daily.  90 tablet  3  . Probiotic Product (PROBIOTIC DAILY) CAPS Take by mouth daily.       No current facility-administered medications on file prior to visit.    Allergies  Allergen Reactions  . Penicillins Anaphylaxis  . Azithromycin Itching  . Iodine Other (See Comments)    wheezing  . Sulfa Antibiotics Hives    Family History  Problem Relation Age of Onset  . Cancer Mother   . Hypertension Mother   . Hyperlipidemia Mother   . Hypertension Father   . Cancer Paternal Grandfather     lung    History   Social History  . Marital Status: Single    Spouse Name: N/A    Number of Children: N/A  . Years of Education: N/A   Social History Main Topics  . Smoking status: Former Smoker    Quit date: 10/10/2006  . Smokeless tobacco: None  . Alcohol Use: No  . Drug Use:  No  . Sexual Activity: None   Other Topics Concern  . None   Social History Narrative  . None    Review of Systems - See HPI.  All other ROS are negative.  BP 96/66  Pulse 76  Temp(Src) 98 F (36.7 C) (Oral)  Resp 16  Ht 5\' 7"  (1.702 m)  Wt 196 lb 8 oz (89.132 kg)  BMI 30.77 kg/m2  SpO2 97%  LMP 07/03/2014  Physical Exam  Vitals reviewed. Constitutional: She is oriented to person, place, and time and well-developed, well-nourished, and in no distress.  HENT:  Head: Normocephalic and atraumatic.  Eyes: Conjunctivae are normal.  Cardiovascular: Normal rate, regular rhythm, normal heart sounds and intact distal pulses.   Pulmonary/Chest: Effort normal and breath sounds normal. No respiratory distress. She has no wheezes. She has no rales. She exhibits no tenderness.  Neurological: She is alert and oriented to person, place, and time.  Skin: Skin is warm and dry. No rash noted.  Psychiatric: Affect normal.    Recent Results (from the past 2160 hour(s))  CBC     Status: None   Collection Time    04/28/14 10:13 AM      Result Value Ref Range   WBC 7.3  4.0 -  10.5 K/uL   RBC 4.33  3.87 - 5.11 Mil/uL   Platelets 357.0  150.0 - 400.0 K/uL   Hemoglobin 12.6  12.0 - 15.0 g/dL   HCT 38.3  36.0 - 46.0 %   MCV 88.5  78.0 - 100.0 fl   MCHC 33.0  30.0 - 36.0 g/dL   RDW 13.6  11.5 - 15.5 %  COMPREHENSIVE METABOLIC PANEL     Status: None   Collection Time    04/28/14 10:13 AM      Result Value Ref Range   Sodium 138  135 - 145 mEq/L   Potassium 3.9  3.5 - 5.1 mEq/L   Chloride 107  96 - 112 mEq/L   CO2 25  19 - 32 mEq/L   Glucose, Bld 71  70 - 99 mg/dL   BUN 16  6 - 23 mg/dL   Creatinine, Ser 0.7  0.4 - 1.2 mg/dL   Total Bilirubin 0.3  0.2 - 1.2 mg/dL   Alkaline Phosphatase 44  39 - 117 U/L   AST 16  0 - 37 U/L   ALT 15  0 - 35 U/L   Total Protein 6.6  6.0 - 8.3 g/dL   Albumin 3.8  3.5 - 5.2 g/dL   Calcium 9.1  8.4 - 10.5 mg/dL   GFR 106.58  >60.00 mL/min  TSH      Status: None   Collection Time    04/28/14 10:13 AM      Result Value Ref Range   TSH 1.57  0.35 - 4.50 uIU/mL  T4     Status: None   Collection Time    04/28/14 10:38 AM      Result Value Ref Range   T4, Total 7.9  5.0 - 12.5 ug/dL  PROLACTIN     Status: None   Collection Time    04/28/14 10:38 AM      Result Value Ref Range   Prolactin 12.1     Comment:      Reference Ranges:                      Female:                       2.1 -  17.1 ng/ml                      Female:   Pregnant          9.7 - 208.5 ng/mL                                Non Pregnant      2.8 -  29.2 ng/mL                                Post Menopausal   1.8 -  20.3 ng/mL                          Assessment/Plan: PCOS (polycystic ovarian syndrome) Medications refilled.  Continue good dietary measures.  Migraine Medication refilled.  Hypothyroidism Medication refilled.  Follow-up in 1 month for Annual Exam with fasting labs.  Will recheck TSH at this time.  Insomnia Increase to Lunesta 3 mg tablets nightly.  Follow-up in 1 month.

## 2014-07-10 NOTE — Progress Notes (Signed)
Pre visit review using our clinic review tool, if applicable. No additional management support is needed unless otherwise documented below in the visit note/SLS  

## 2014-07-10 NOTE — Patient Instructions (Addendum)
Please continue medications as directed.  I am increasing your Lunesta 3 mg nightly to help with insomnia.  For itching and swelling in ears, use Vosol-HC as directed.  May benefit from a Claritin each morning.

## 2014-07-14 DIAGNOSIS — G4700 Insomnia, unspecified: Secondary | ICD-10-CM | POA: Insufficient documentation

## 2014-07-14 NOTE — Assessment & Plan Note (Signed)
Medication refilled

## 2014-07-14 NOTE — Assessment & Plan Note (Signed)
Increase to Lunesta 3 mg tablets nightly.  Follow-up in 1 month.

## 2014-07-14 NOTE — Assessment & Plan Note (Signed)
Medications refilled.  Continue good dietary measures.

## 2014-07-14 NOTE — Assessment & Plan Note (Signed)
Medication refilled.  Follow-up in 1 month for Annual Exam with fasting labs.  Will recheck TSH at this time.

## 2014-07-18 ENCOUNTER — Encounter: Payer: Self-pay | Admitting: Physician Assistant

## 2014-07-18 ENCOUNTER — Ambulatory Visit: Payer: Self-pay | Admitting: Physician Assistant

## 2014-07-23 ENCOUNTER — Telehealth: Payer: Self-pay | Admitting: Physician Assistant

## 2014-07-23 NOTE — Telephone Encounter (Signed)
Dismissal Letter sent by Certified Mail [phone]  Received the Return Receipt showing that Arlene Orrell picked up Dismissal 07/29/2014

## 2014-07-29 ENCOUNTER — Ambulatory Visit: Payer: Self-pay | Admitting: Physician Assistant

## 2014-08-28 ENCOUNTER — Other Ambulatory Visit: Payer: Self-pay | Admitting: Family Medicine

## 2014-08-28 DIAGNOSIS — Z1239 Encounter for other screening for malignant neoplasm of breast: Secondary | ICD-10-CM

## 2014-09-05 ENCOUNTER — Ambulatory Visit: Payer: Self-pay

## 2014-09-11 ENCOUNTER — Other Ambulatory Visit: Payer: Self-pay | Admitting: Family Medicine

## 2014-09-11 DIAGNOSIS — Z1231 Encounter for screening mammogram for malignant neoplasm of breast: Secondary | ICD-10-CM

## 2014-09-17 ENCOUNTER — Ambulatory Visit: Payer: Self-pay

## 2014-09-19 ENCOUNTER — Ambulatory Visit
Admission: RE | Admit: 2014-09-19 | Discharge: 2014-09-19 | Disposition: A | Discharge status: Home / Self Care | Payer: Federal, State, Local not specified - PPO | Source: Ambulatory Visit | Attending: Family Medicine | Admitting: Family Medicine

## 2014-09-19 DIAGNOSIS — Z1231 Encounter for screening mammogram for malignant neoplasm of breast: Secondary | ICD-10-CM

## 2014-09-22 ENCOUNTER — Other Ambulatory Visit: Payer: Self-pay | Admitting: Family Medicine

## 2014-09-22 DIAGNOSIS — N644 Mastodynia: Secondary | ICD-10-CM

## 2014-09-22 DIAGNOSIS — N63 Unspecified lump in unspecified breast: Secondary | ICD-10-CM

## 2014-10-02 ENCOUNTER — Ambulatory Visit
Admission: RE | Admit: 2014-10-02 | Discharge: 2014-10-02 | Disposition: A | Discharge status: Home / Self Care | Payer: Federal, State, Local not specified - PPO | Source: Ambulatory Visit | Attending: Family Medicine | Admitting: Family Medicine

## 2014-10-02 DIAGNOSIS — N63 Unspecified lump in unspecified breast: Secondary | ICD-10-CM

## 2014-10-02 DIAGNOSIS — N644 Mastodynia: Secondary | ICD-10-CM

## 2014-12-25 ENCOUNTER — Ambulatory Visit: Payer: Self-pay | Admitting: Physician Assistant

## 2015-02-17 ENCOUNTER — Telehealth: Payer: Self-pay | Admitting: *Deleted

## 2015-02-17 NOTE — Telephone Encounter (Signed)
Medical record request received via mail from [place]. Forwarded to [place] to scan/email to medical records. [id number]
